# Patient Record
Sex: Male | Born: 1990 | Hispanic: Yes | Marital: Married | Smoking: Never smoker
Health system: Southern US, Community
[De-identification: ages and names within clinical notes are randomized; demographics above are authoritative.]

## PROBLEM LIST (undated history)

## (undated) DIAGNOSIS — J45909 Unspecified asthma, uncomplicated: Secondary | ICD-10-CM

## (undated) DIAGNOSIS — T7840XA Allergy, unspecified, initial encounter: Secondary | ICD-10-CM

## (undated) HISTORY — PX: APPENDECTOMY: SHX54

## (undated) HISTORY — PX: WISDOM TOOTH EXTRACTION: SHX21

## (undated) HISTORY — DX: Allergy, unspecified, initial encounter: T78.40XA

## (undated) HISTORY — DX: Unspecified asthma, uncomplicated: J45.909

---
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
## 2014-02-19 ENCOUNTER — Emergency Department: Payer: Self-pay | Admitting: Emergency Medicine

## 2015-11-27 IMAGING — CR DG LUMBAR SPINE 2-3V
1 series · 3 of 3 positions shown · non-contrast
Comparison: None

CLINICAL DATA: Fell 6 feet from ladder landing on LEFT side, LEFT
hip and low back pain

EXAM:
LUMBAR SPINE - 2-3 VIEW

[Series 1: dxr lumbar spine ap and lateral · 0.14mm/px · 3 of 3 slices shown]
[im 1/3]
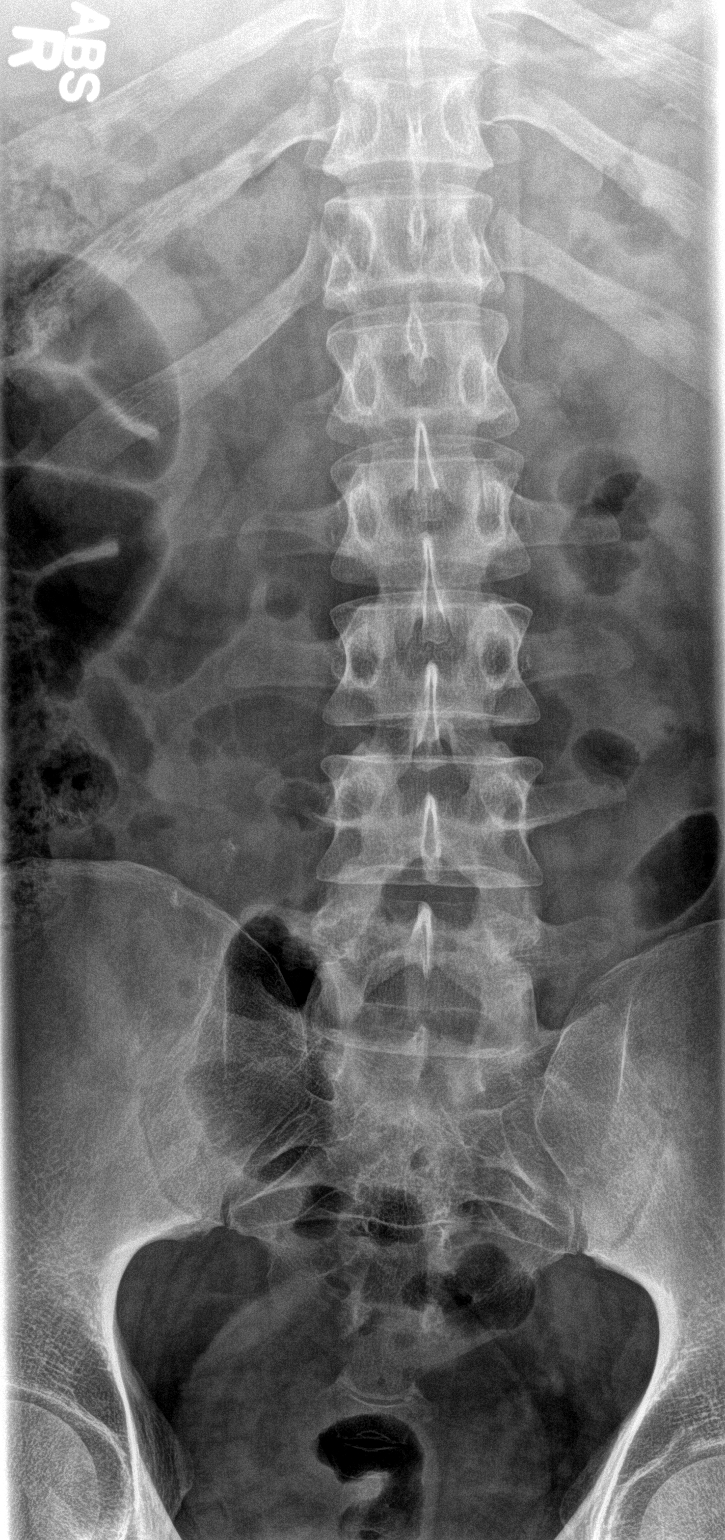
[im 2/3]
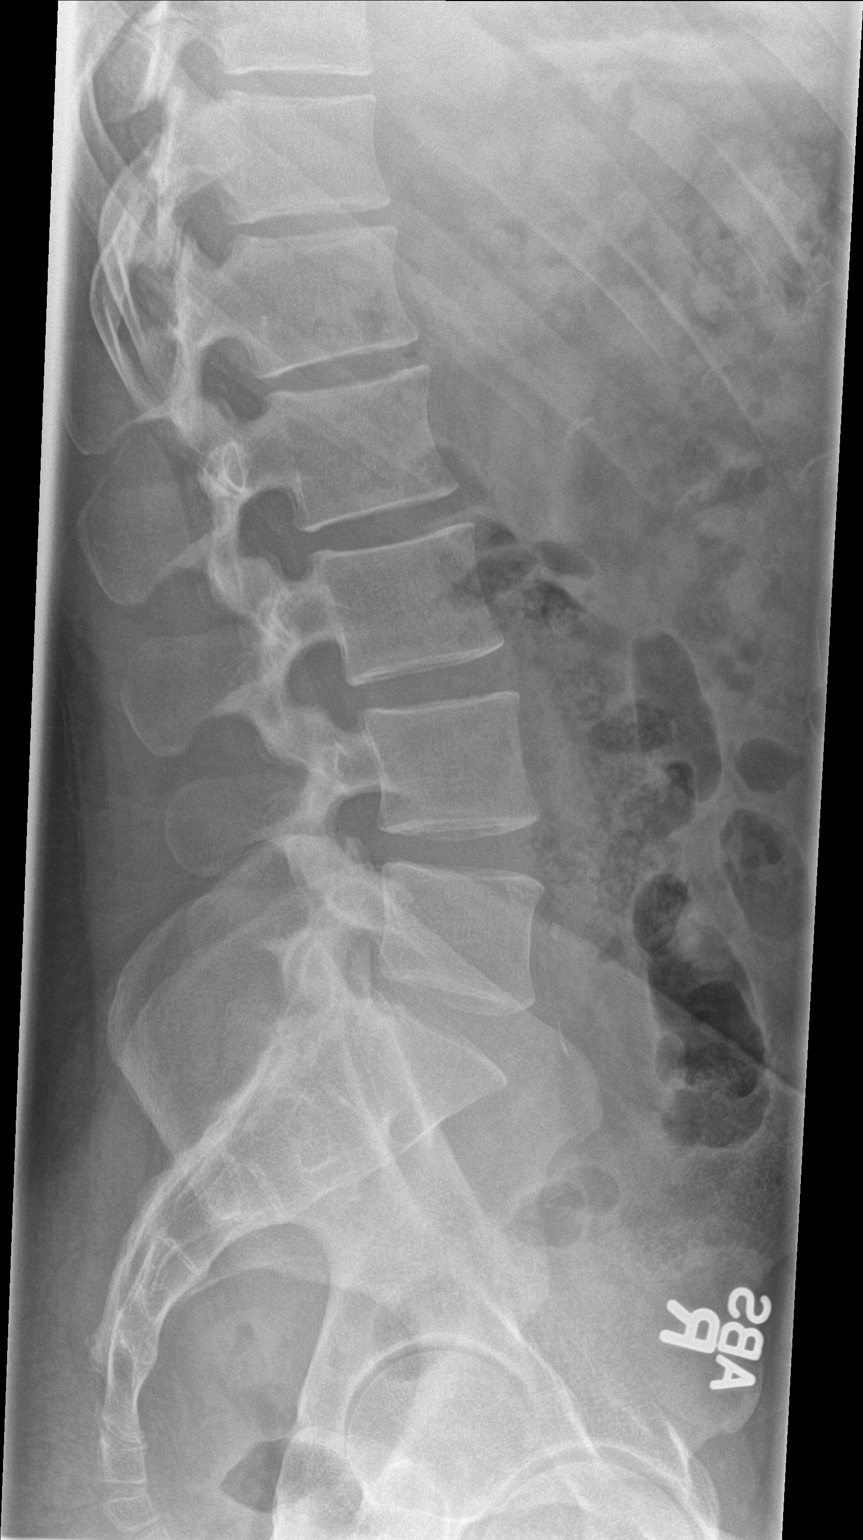
[im 3/3]
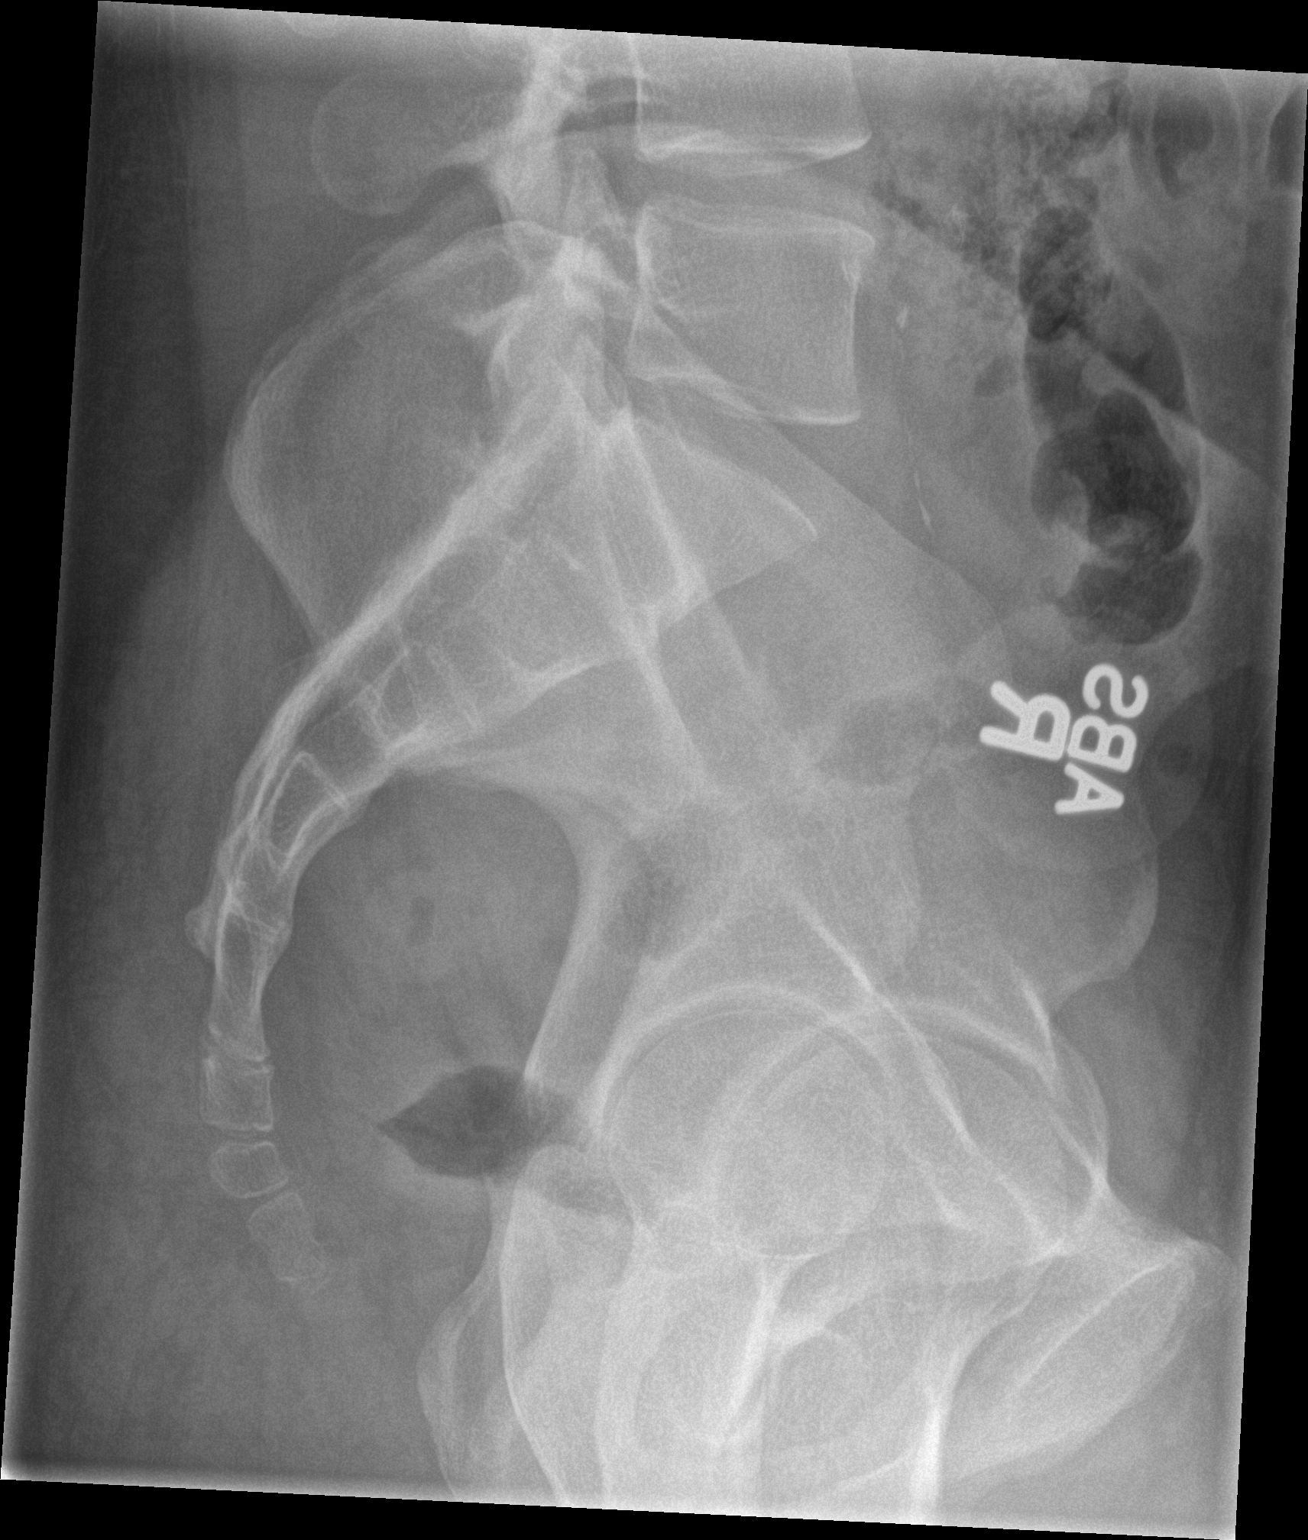

[3 of 3 positions shown; findings below may reference images not displayed]

FINDINGS: Osseous mineralization grossly normal.

5 non-rib-bearing lumbar vertebrae.

Vertebral body and disc space heights maintained.

No acute fracture, subluxation or bone destruction.

No gross evidence of spondylolysis.

SI joints preserved with visualized portion of pelvis intact.

Minimal RIGHT paraspinal density at L5-S1 of uncertain etiology,
question bowel artifact.
IMPRESSION: Normal lumbar spine radiographs.

## 2015-11-27 IMAGING — CR DG FEMUR 2V*L*
1 series · 4 of 4 positions shown · non-contrast
Comparison: None.

CLINICAL DATA: Recent fall of approximately 6 feet with left hip
pain

EXAM:
LEFT FEMUR - 2 VIEW

[Series 1: dxr femur left · 0.14mm/px · 4 of 4 slices shown]
[im 1/4]
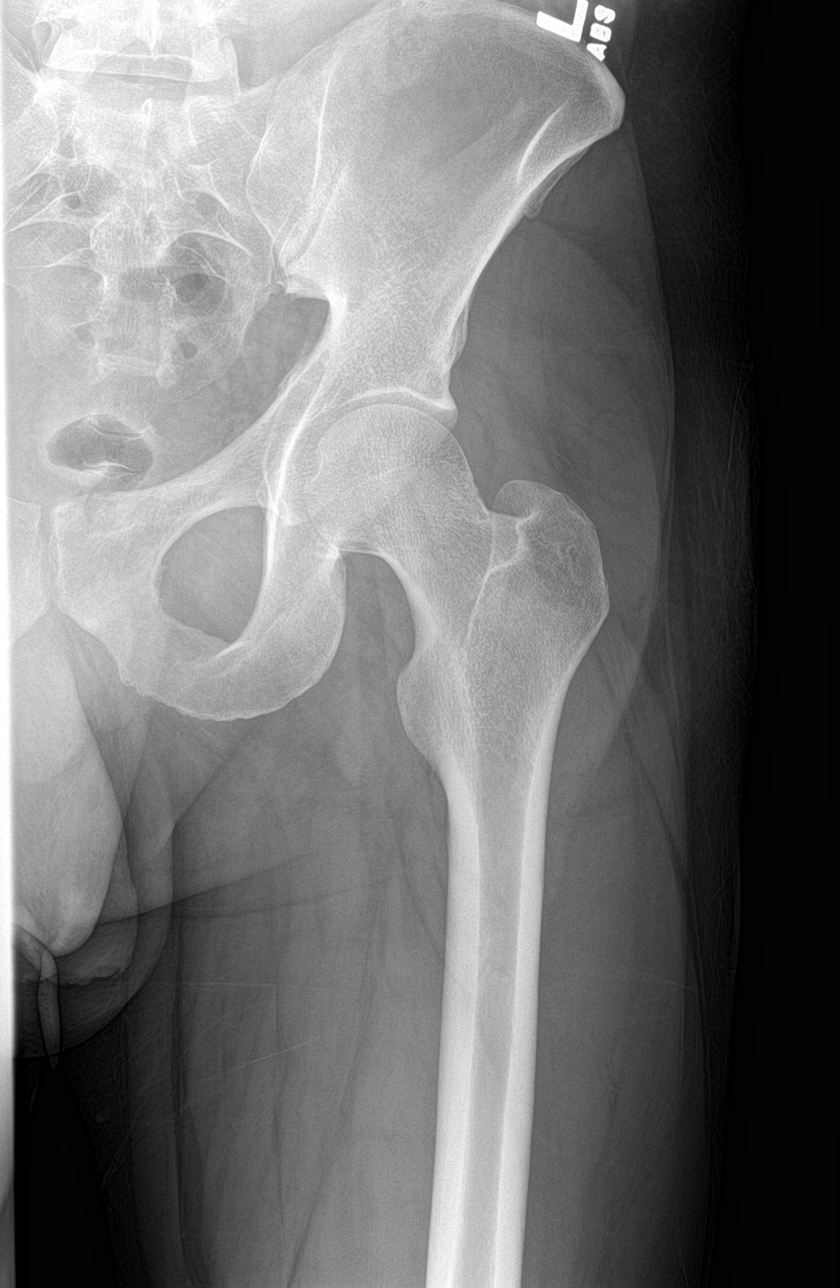
[im 2/4]
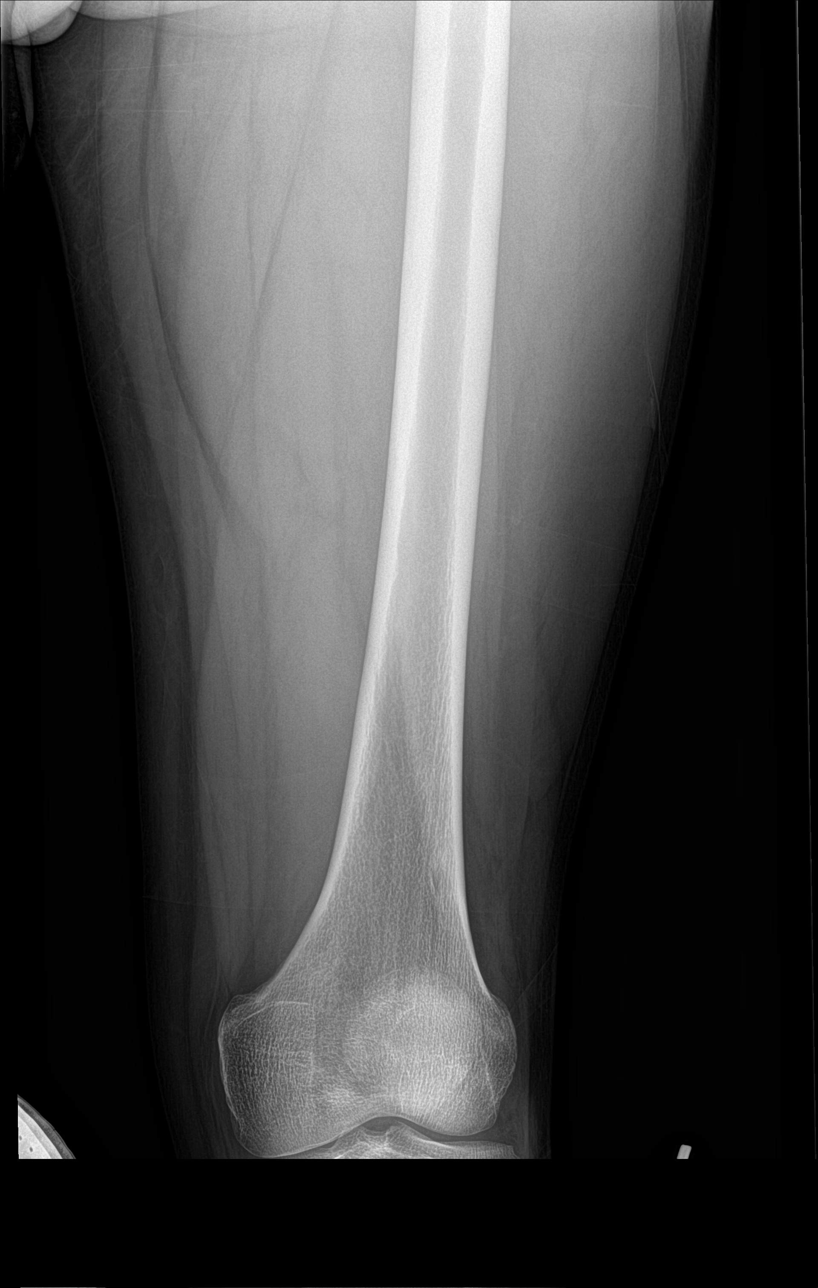
[im 3/4]
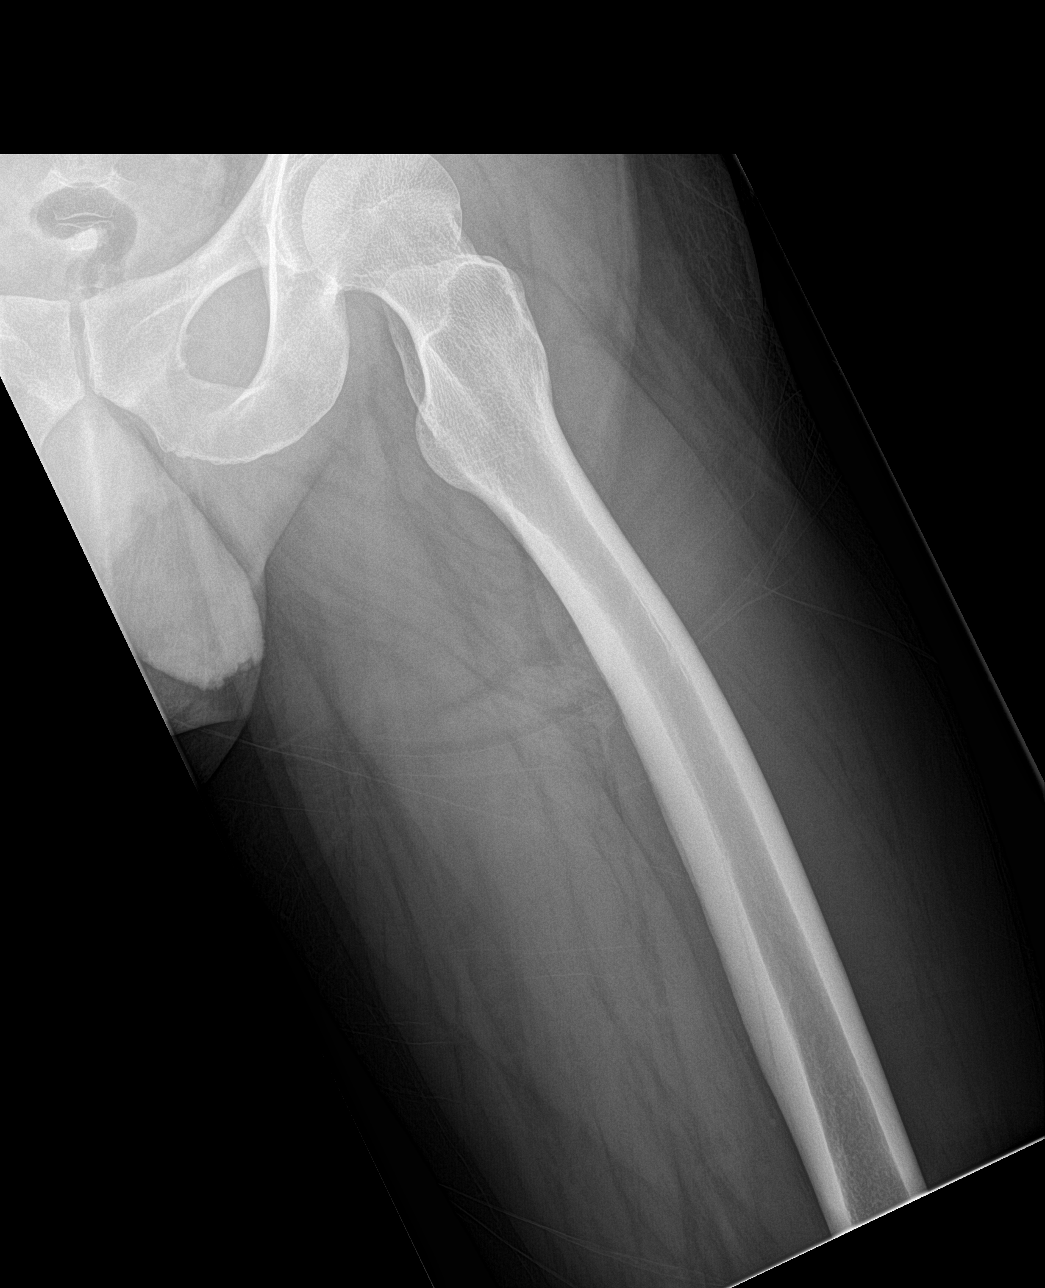
[im 4/4]
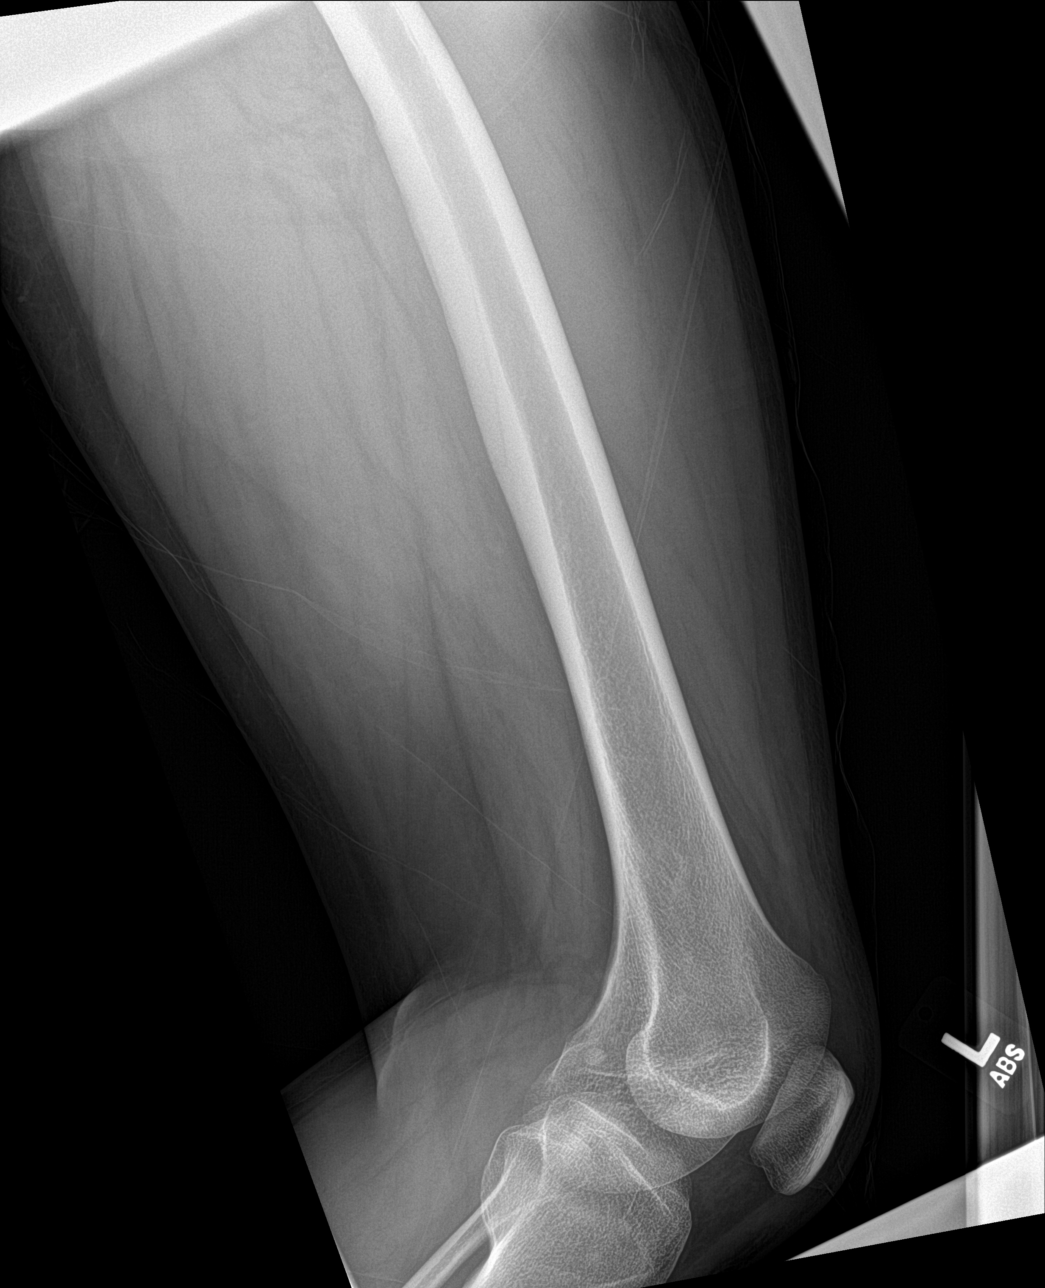

[4 of 4 positions shown; findings below may reference images not displayed]

FINDINGS: The left hip and femur are unremarkable with no acute abnormality.
Alignment is normal.
IMPRESSION: Negative.

## 2018-05-10 ENCOUNTER — Ambulatory Visit: Payer: Self-pay | Admitting: Internal Medicine

## 2018-05-16 ENCOUNTER — Ambulatory Visit: Payer: Self-pay | Admitting: Internal Medicine

## 2018-06-12 ENCOUNTER — Ambulatory Visit (INDEPENDENT_AMBULATORY_CARE_PROVIDER_SITE_OTHER): Payer: 59 | Admitting: Internal Medicine

## 2018-06-12 ENCOUNTER — Encounter: Payer: Self-pay | Admitting: Internal Medicine

## 2018-06-12 ENCOUNTER — Other Ambulatory Visit: Payer: Self-pay

## 2018-06-12 VITALS — BP 140/90 | HR 91 | Temp 98.2°F | Ht 68.0 in | Wt 186.2 lb

## 2018-06-12 DIAGNOSIS — Z1159 Encounter for screening for other viral diseases: Secondary | ICD-10-CM | POA: Diagnosis not present

## 2018-06-12 DIAGNOSIS — Z0184 Encounter for antibody response examination: Secondary | ICD-10-CM | POA: Diagnosis not present

## 2018-06-12 DIAGNOSIS — J309 Allergic rhinitis, unspecified: Secondary | ICD-10-CM

## 2018-06-12 DIAGNOSIS — Z1389 Encounter for screening for other disorder: Secondary | ICD-10-CM | POA: Diagnosis not present

## 2018-06-12 DIAGNOSIS — Z1329 Encounter for screening for other suspected endocrine disorder: Secondary | ICD-10-CM | POA: Diagnosis not present

## 2018-06-12 DIAGNOSIS — E559 Vitamin D deficiency, unspecified: Secondary | ICD-10-CM | POA: Diagnosis not present

## 2018-06-12 DIAGNOSIS — Z0000 Encounter for general adult medical examination without abnormal findings: Secondary | ICD-10-CM

## 2018-06-12 DIAGNOSIS — Z13220 Encounter for screening for lipoid disorders: Secondary | ICD-10-CM | POA: Diagnosis not present

## 2018-06-12 DIAGNOSIS — R030 Elevated blood-pressure reading, without diagnosis of hypertension: Secondary | ICD-10-CM

## 2018-06-12 DIAGNOSIS — Z23 Encounter for immunization: Secondary | ICD-10-CM | POA: Diagnosis not present

## 2018-06-12 DIAGNOSIS — J4520 Mild intermittent asthma, uncomplicated: Secondary | ICD-10-CM

## 2018-06-12 LAB — COMPREHENSIVE METABOLIC PANEL
ALK PHOS: 94 U/L (ref 39–117)
ALT: 39 U/L (ref 0–53)
AST: 30 U/L (ref 0–37)
Albumin: 4.7 g/dL (ref 3.5–5.2)
BILIRUBIN TOTAL: 0.6 mg/dL (ref 0.2–1.2)
BUN: 16 mg/dL (ref 6–23)
CO2: 29 meq/L (ref 19–32)
Calcium: 9.7 mg/dL (ref 8.4–10.5)
Chloride: 102 mEq/L (ref 96–112)
Creatinine, Ser: 0.92 mg/dL (ref 0.40–1.50)
GFR: 97.96 mL/min (ref >60.00)
GLUCOSE: 99 mg/dL (ref 70–99)
Potassium: 4.6 mEq/L (ref 3.5–5.1)
SODIUM: 137 meq/L (ref 135–145)
TOTAL PROTEIN: 7.7 g/dL (ref 6.0–8.3)

## 2018-06-12 LAB — CBC WITH DIFFERENTIAL/PLATELET
BASOS ABS: 0 10*3/uL (ref 0.0–0.1)
Basophils Relative: 0.6 % (ref 0.0–3.0)
Eosinophils Absolute: 0.4 10*3/uL (ref 0.0–0.7)
Eosinophils Relative: 7.4 % — ABNORMAL HIGH (ref 0.0–5.0)
HCT: 46.6 % (ref 39.0–52.0)
Hemoglobin: 16.3 g/dL (ref 13.0–17.0)
LYMPHS ABS: 1.8 10*3/uL (ref 0.7–4.0)
Lymphocytes Relative: 34.3 % (ref 12.0–46.0)
MCHC: 34.9 g/dL (ref 30.0–36.0)
MCV: 89.1 fl (ref 78.0–100.0)
MONO ABS: 0.3 10*3/uL (ref 0.1–1.0)
Monocytes Relative: 5.9 % (ref 3.0–12.0)
NEUTROS ABS: 2.7 10*3/uL (ref 1.4–7.7)
NEUTROS PCT: 51.8 % (ref 43.0–77.0)
PLATELETS: 223 10*3/uL (ref 150.0–400.0)
RBC: 5.23 Mil/uL (ref 4.22–5.81)
RDW: 13.2 % (ref 11.5–15.5)
WBC: 5.2 10*3/uL (ref 4.0–10.5)

## 2018-06-12 LAB — LIPID PANEL
CHOLESTEROL: 205 mg/dL — AB (ref 0–200)
HDL: 43.9 mg/dL (ref >39.00)
LDL Cholesterol: 135 mg/dL — ABNORMAL HIGH (ref 0–99)
NONHDL: 161.4
Total CHOL/HDL Ratio: 5
Triglycerides: 132 mg/dL (ref 0.0–149.0)
VLDL: 26.4 mg/dL (ref 0.0–40.0)

## 2018-06-12 LAB — VITAMIN D 25 HYDROXY (VIT D DEFICIENCY, FRACTURES): VITD: 23.06 ng/mL — AB (ref 30.00–100.00)

## 2018-06-12 LAB — TSH: TSH: 2.1 u[IU]/mL (ref 0.35–4.50)

## 2018-06-12 LAB — T4, FREE: Free T4: 0.88 ng/dL (ref 0.60–1.60)

## 2018-06-12 MED ORDER — MONTELUKAST SODIUM 10 MG PO TABS: 10.0000 mg | ORAL_TABLET | Freq: Every day | ORAL | 3 refills | Status: DC

## 2018-06-12 MED ORDER — ALBUTEROL SULFATE HFA 108 (90 BASE) MCG/ACT IN AERS: 1.0000 | INHALATION_SPRAY | Freq: Four times a day (QID) | RESPIRATORY_TRACT | 12 refills | Status: DC | PRN

## 2018-06-12 NOTE — Progress Notes (Signed)
Chief Complaint  Patient presents with  . Establish Care   New patient  1. C/o allergies and asthma with sob and wheezing worse with pollen never allergy tested tried flonase claritin D, benadryl eyes swollen puffy, runny nose, cough with pollen exposure was on singular in teens and helped   Review of Systems  Constitutional: Negative for weight loss.  HENT: Negative for hearing loss.   Eyes: Negative for blurred vision.  Respiratory: Positive for shortness of breath and wheezing.        Sob wheezing with allergies   Cardiovascular: Negative for chest pain.  Gastrointestinal: Negative for abdominal pain.  Musculoskeletal: Negative for falls.  Skin: Negative for rash.  Neurological: Negative for headaches.  Endo/Heme/Allergies: Positive for environmental allergies.  Psychiatric/Behavioral: Negative for depression.   Past Medical History:  Diagnosis Date  . Allergy   . Asthma    Past Surgical History:  Procedure Laterality Date  . APPENDECTOMY    . WISDOM TOOTH EXTRACTION     Family History  Problem Relation Age of Onset  . Cancer Mother        breast   . Arthritis Mother   . Miscarriages / [place] Mother   . Diabetes Father   . Alcohol abuse Father   . Arthritis Father   . Drug abuse Father   . Hyperlipidemia Father   . Hypertension Father   . Kidney disease Father   . Diabetes Other        mat and pat aunts/uncles with DM   . Hypertension Brother   . Kidney disease Maternal Grandfather   . Early death Paternal Grandmother   . Early death Paternal Grandfather   . Diabetes Sibling   . Drug abuse Sibling   . Hearing loss Sibling   . Miscarriages / Stillbirths Sister   . Alcohol abuse Sister   . Asthma Sister   . Depression Sister   . Alcohol abuse Brother   . Asthma Brother   . Drug abuse Brother   . Early death Brother    Social History   Socioeconomic History  . Marital status: Single    Spouse name: Not on file  . Number of children: Not on file   . Years of education: Not on file  . Highest education level: Not on file  Occupational History  . Not on file  Social Needs  . Financial resource strain: Not on file  . Food insecurity:    Worry: Not on file    Inability: Not on file  . Transportation needs:    Medical: Not on file    Non-medical: Not on file  Tobacco Use  . Smoking status: Never Smoker  . Smokeless tobacco: Never Used  Substance and Sexual Activity  . Alcohol use: Yes  . Drug use: Not Currently  . Sexual activity: Yes  Lifestyle  . Physical activity:    Days per week: Not on file    Minutes per session: Not on file  . Stress: Not on file  Relationships  . Social connections:    Talks on phone: Not on file    Gets together: Not on file    Attends religious service: Not on file    Active member of club or organization: Not on file    Attends meetings of clubs or organizations: Not on file    Relationship status: Not on file  . Intimate partner violence:    Fear of current or ex partner: Not on file  Emotionally abused: Not on file    Physically abused: Not on file    Forced sexual activity: Not on file  Other Topics Concern  . Not on file  Social History Narrative   Married    1 daughter    Works with gutters     No guns, wears seat belt, safe in relationship    HS ed    General labor    Current Meds  Medication Sig  . fexofenadine (ALLEGRA) 180 MG tablet Take 180 mg by mouth daily.   No Known Allergies No results found for this or any previous visit (from the past 2160 hour(s)). Objective  Body mass index is 28.31 kg/m. Wt Readings from Last 3 Encounters:  06/12/18 186 lb 3.2 oz (84.5 kg)   Temp Readings from Last 3 Encounters:  06/12/18 98.2 F (36.8 C) (Oral)   BP Readings from Last 3 Encounters:  06/12/18 140/90   Pulse Readings from Last 3 Encounters:  06/12/18 91    Physical Exam Vitals signs and nursing note reviewed.  Constitutional:      Appearance: Normal  appearance. He is well-developed and well-groomed.  HENT:     Head: Normocephalic and atraumatic.     Nose: Nose normal.     Mouth/Throat:     Mouth: Mucous membranes are moist.     Pharynx: Oropharynx is clear.  Eyes:     Conjunctiva/sclera: Conjunctivae normal.     Pupils: Pupils are equal, round, and reactive to light.  Cardiovascular:     Rate and Rhythm: Normal rate and regular rhythm.     Heart sounds: Normal heart sounds. No murmur.  Pulmonary:     Effort: Pulmonary effort is normal.     Breath sounds: Normal breath sounds.  Skin:    General: Skin is warm and dry.  Neurological:     General: No focal deficit present.     Mental Status: He is alert and oriented to person, place, and time. Mental status is at baseline.     Gait: Gait normal.  Psychiatric:        Attention and Perception: Attention and perception normal.        Mood and Affect: Mood and affect normal.        Speech: Speech normal.        Behavior: Behavior normal. Behavior is cooperative.        Thought Content: Thought content normal.        Cognition and Memory: Cognition and memory normal.        Judgment: Judgment normal.     Assessment   1.asthma and allergies uncontrolled  2. Elevated BP 3. HM Plan   1. Singulair, prn otc allergy med doing benadryl for now or other options xyzal 5 mg qhs prn or claritin, allegra, zyrtec  Prn Albuterol inhaler  Hold referral allergy for now  2. Elevated BP will monitor  rec no decongestants  reecheck at f/u  3.  Never had flu  Tdap given today  Fasting labs today   Provider: Dr. French Ana McLean-Scocuzza-Internal Medicine

## 2018-06-12 NOTE — Patient Instructions (Addendum)
No pseduoephedrine or phenylephrine  Another antihistamine Xyzal    Tdap Tdap Vaccine (Tetanus, Diphtheria and Pertussis): What You Need to Know 1. Why get vaccinated? Tetanus, diphtheria and pertussis are very serious diseases. Tdap vaccine can protect [place] from these diseases. And, Tdap vaccine given to pregnant women can protect newborn babies against pertussis.Marland Kitchen TETANUS (Lockjaw) is rare in the [place] today. It causes painful muscle tightening and stiffness, usually all over the body.  It can lead to tightening of muscles in the head and neck so you can't open your mouth, swallow, or sometimes even breathe. Tetanus kills about 1 out of 10 people who are infected even after receiving the best medical care. DIPHTHERIA is also rare in the [place] today. It can cause a thick coating to form in the back of the throat.  It can lead to breathing problems, heart failure, paralysis, and death. PERTUSSIS (Whooping Cough) causes severe coughing spells, which can cause difficulty breathing, vomiting and disturbed sleep.  It can also lead to weight loss, incontinence, and rib fractures. Up to 2 in 100 adolescents and 5 in 100 adults with pertussis are hospitalized or have complications, which could include pneumonia or death. These diseases are caused by bacteria. Diphtheria and pertussis are spread from person to person through secretions from coughing or sneezing. Tetanus enters the body through cuts, scratches, or wounds. Before vaccines, as many as 200,000 cases of diphtheria, 200,000 cases of pertussis, and hundreds of cases of tetanus, were reported in the [place] each year. Since vaccination began, reports of cases for tetanus and diphtheria have dropped by about 99% and for pertussis by about 80%. 2. Tdap vaccine Tdap vaccine can protect adolescents and adults from tetanus, diphtheria, and pertussis. One dose of Tdap is routinely given at age 54 or 71. People who did not get Tdap  at that age should get it as soon as possible. Tdap is especially important for healthcare professionals and anyone having close contact with a baby younger than 12 months. Pregnant women should get a dose of Tdap during every pregnancy, to protect the newborn from pertussis. Infants are most at risk for severe, life-threatening complications from pertussis. Another vaccine, called Td, protects against tetanus and diphtheria, but not pertussis. A Td booster should be given every 10 years. Tdap may be given as one of these boosters if you have never gotten Tdap before. Tdap may also be given after a severe cut or burn to prevent tetanus infection. Your doctor or the person giving you the vaccine can give you more information. Tdap may safely be given at the same time as other vaccines. 3. Some people should not get this vaccine  A person who has ever had a life-threatening allergic reaction after a previous dose of any diphtheria, tetanus or pertussis containing vaccine, OR has a severe allergy to any part of this vaccine, should not get Tdap vaccine. Tell the person giving the vaccine about any severe allergies.  Anyone who had coma or long repeated seizures within 7 days after a childhood dose of DTP or DTaP, or a previous dose of Tdap, should not get Tdap, unless a cause other than the vaccine was found. They can still get Td.  Talk to your doctor if you: ? have seizures or another nervous system problem, ? had severe pain or swelling after any vaccine containing diphtheria, tetanus or pertussis, ? ever had a condition called Guillain-Barr Syndrome (GBS), ? aren't feeling well on the day the  shot is scheduled. 4. Risks With any medicine, including vaccines, there is a chance of side effects. These are usually mild and go away on their own. Serious reactions are also possible but are rare. Most people who get Tdap vaccine do not have any problems with it. Mild problems following Tdap (Did not  interfere with activities)  Pain where the shot was given (about 3 in 4 adolescents or 2 in 3 adults)  Redness or swelling where the shot was given (about 1 person in 5)  Mild fever of at least 100.8F (up to about 1 in 25 adolescents or 1 in 100 adults)  Headache (about 3 or 4 people in 10)  Tiredness (about 1 person in 3 or 4)  Nausea, vomiting, diarrhea, stomach ache (up to 1 in 4 adolescents or 1 in 10 adults)  Chills, sore joints (about 1 person in 10)  Body aches (about 1 person in 3 or 4)  Rash, swollen glands (uncommon) Moderate problems following Tdap (Interfered with activities, but did not require medical attention)  Pain where the shot was given (up to 1 in 5 or 6)  Redness or swelling where the shot was given (up to about 1 in 16 adolescents or 1 in 12 adults)  Fever over 102F (about 1 in 100 adolescents or 1 in 250 adults)  Headache (about 1 in 7 adolescents or 1 in 10 adults)  Nausea, vomiting, diarrhea, stomach ache (up to 1 or 3 people in 100)  Swelling of the entire arm where the shot was given (up to about 1 in 500). Severe problems following Tdap (Unable to perform usual activities; required medical attention)  Swelling, severe pain, bleeding and redness in the arm where the shot was given (rare). Problems that could happen after any vaccine:  People sometimes faint after a medical procedure, including vaccination. Sitting or lying down for about 15 minutes can help prevent fainting, and injuries caused by a fall. Tell your doctor if you feel dizzy, or have vision changes or ringing in the ears.  Some people get severe pain in the shoulder and have difficulty moving the arm where a shot was given. This happens very rarely.  Any medication can cause a severe allergic reaction. Such reactions from a vaccine are very rare, estimated at fewer than 1 in a million doses, and would happen within a few minutes to a few hours after the vaccination. As with any  medicine, there is a very remote chance of a vaccine causing a serious injury or death. The safety of vaccines is always being monitored. For more information, visit: http://floyd.org/ 5. What if there is a serious problem? What should I look for?  Look for anything that concerns you, such as signs of a severe allergic reaction, very high fever, or unusual behavior. Signs of a severe allergic reaction can include hives, swelling of the face and throat, difficulty breathing, a fast heartbeat, dizziness, and weakness. These would usually start a few minutes to a few hours after the vaccination. What should I do?  If you think it is a severe allergic reaction or other emergency that can't wait, call 9-1-1 or get the person to the nearest hospital. Otherwise, call your doctor.  Afterward, the reaction should be reported to the Vaccine Adverse Event Reporting System (VAERS). Your doctor might file this report, or you can do it yourself through the VAERS web site at www.vaers.LAgents.no, or by calling 1-(408)398-3590. VAERS does not give medical advice. 6. The [employer]  Vaccine Injury Compensation Program The National Vaccine Injury Compensation Program (VICP) is a federal program that was created to compensate people who may have been injured by certain vaccines. Persons who believe they may have been injured by a vaccine can learn about the program and about filing a claim by calling 1-858-620-0498 or visiting the VICP website at SpiritualWord.at. There is a time limit to file a claim for compensation. 7. How can I learn more?  Ask your doctor. He or she can give you the vaccine package insert or suggest other sources of information.  Call your local or state health department.  Contact the Centers for Disease Control and Prevention (CDC): ? Call (575) 577-9000 (1-800-CDC-INFO) or ? Visit CDC's website at PicCapture.uy Vaccine Information Statement Tdap Vaccine  (05/13/2013) This information is not intended to replace advice given to you by your health care provider. Make sure you discuss any questions you have with your health care provider. Document Released: 09/05/2011 Document Revised: 10/22/2017 Document Reviewed: 10/22/2017 Elsevier Interactive Patient Education  2019 Elsevier Inc.    Allergies, Adult An allergy is when your body's defense system (immune system) overreacts to an otherwise harmless substance (allergen) that you breathe in or eat or something that touches your skin. When you come into contact with something that you are allergic to, your immune system produces certain proteins (antibodies). These proteins cause cells to release chemicals (histamines) that trigger the symptoms of an allergic reaction. Allergies often affect the nasal passages (allergic rhinitis), eyes (allergic conjunctivitis), skin (atopic dermatitis), and stomach. Allergies can be mild or severe. Allergies cannot spread from person to person (are not contagious). They can develop at any age and may be outgrown. What increases the risk? You may be at greater risk of allergies if other people in your family have allergies. What are the signs or symptoms? Symptoms depend on what type of allergy you have. They may include:  Runny, stuffy nose.  Sneezing.  Itchy mouth, ears, or throat.  Postnasal drip.  Sore throat.  Itchy, red, watery, or puffy eyes.  Skin rash or hives.  Stomach pain.  Vomiting.  Diarrhea.  Bloating.  Wheezing or coughing. People with a severe allergy to food, medicine, or an insect bite may have a life-threatening allergic reaction (anaphylaxis). Symptoms of anaphylaxis include:  Hives.  Itching.  Flushed face.  Swollen lips, tongue, or mouth.  Tight or swollen throat.  Chest pain or tightness in the chest.  Trouble breathing or shortness of breath.  Rapid heartbeat.  Dizziness or  fainting.  Vomiting.  Diarrhea.  Pain in the abdomen. How is this diagnosed? This condition is diagnosed based on:  Your symptoms.  Your family and medical history.  A physical exam. You may need to see a health care provider who specializes in treating allergies (allergist). You may also have tests, including:  Skin tests to see which allergens are causing your symptoms, such as: ? Skin prick test. In this test, your skin is pricked with a tiny needle and exposed to small amounts of possible allergens to see if your skin reacts. ? Intradermal skin test. In this test, a small amount of allergen is injected under your skin to see if your skin reacts. ? Patch test. In this test, a small amount of allergen is placed on your skin and then your skin is covered with a bandage. Your health care provider will check your skin after a couple of days to see if a rash has developed.  Blood  tests.  Challenges tests. In this test, you inhale a small amount of allergen by mouth to see if you have an allergic reaction. You may also be asked to:  Keep a food diary. A food diary is a record of all the foods and drinks you have in a day and any symptoms you experience.  Practice an elimination diet. An elimination diet involves eliminating specific foods from your diet and then adding them back in one by one to find out if a certain food causes an allergic reaction. How is this treated? Treatment for allergies depends on your symptoms. Treatment may include:  Cold compresses to soothe itching and swelling.  Eye drops.  Nasal sprays.  Using a saline spray or container (neti pot) to flush out the nose (nasal irrigation). These methods can help clear away mucus and keep the nasal passages moist.  Using a humidifier.  Oral antihistamines or other medicines to block allergic reaction and inflammation.  Skin creams to treat rashes or itching.  Diet changes to eliminate food allergy  triggers.  Repeated exposure to tiny amounts of allergens to build up a tolerance and prevent future allergic reactions (immunotherapy). These include: ? Allergy shots. ? Oral treatment. This involves taking small doses of an allergen under the tongue (sublingual immunotherapy).  Emergency epinephrine injection (auto-injector) in case of an allergic emergency. This is a self-injectable, pre-measured medicine that must be given within the first few minutes of a serious allergic reaction. Follow these instructions at home:         Avoid known allergens whenever possible.  If you suffer from airborne allergens, wash out your nose daily. You can do this with a saline spray or a neti pot to flush out your nose (nasal irrigation).  Take over-the-counter and prescription medicines only as told by your health care provider.  Keep all follow-up visits as told by your health care provider. This is important.  If you are at risk of a severe allergic reaction (anaphylaxis), keep your auto-injector with you at all times.  If you have ever had anaphylaxis, wear a medical alert bracelet or necklace that states you have a severe allergy. Contact a health care provider if:  Your symptoms do not improve with treatment. Get help right away if:  You have symptoms of anaphylaxis, such as: ? Swollen mouth, tongue, or throat. ? Pain or tightness in your chest. ? Trouble breathing or shortness of breath. ? Dizziness or fainting. ? Severe abdominal pain, vomiting, or diarrhea. This information is not intended to replace advice given to you by your health care provider. Make sure you discuss any questions you have with your health care provider. Document Released: 05/30/2002 Document Revised: 07/05/2016 Document Reviewed: 09/22/2015 Elsevier Interactive Patient Education  2019 [employer].

## 2018-06-13 LAB — URINALYSIS, ROUTINE W REFLEX MICROSCOPIC
BACTERIA UA: NONE SEEN /HPF
Bilirubin Urine: NEGATIVE
Glucose, UA: NEGATIVE
HYALINE CAST: NONE SEEN /LPF
Hgb urine dipstick: NEGATIVE
Ketones, ur: NEGATIVE
Nitrite: NEGATIVE
Protein, ur: NEGATIVE
SPECIFIC GRAVITY, URINE: 1.02 (ref 1.001–1.03)
SQUAMOUS EPITHELIAL / LPF: NONE SEEN /HPF (ref <=5)
WBC, UA: NONE SEEN /HPF (ref 0–5)
pH: 5.5 (ref 5.0–8.0)

## 2018-06-13 LAB — MEASLES/MUMPS/RUBELLA IMMUNITY
Mumps IgG: <9 AU/mL — ABNORMAL LOW
Rubella: <0.9 index — ABNORMAL LOW
Rubeola IgG: 16.6 AU/mL

## 2018-06-13 LAB — HEPATITIS B SURFACE ANTIBODY, QUANTITATIVE

## 2018-09-23 ENCOUNTER — Telehealth: Payer: Self-pay

## 2018-09-23 NOTE — Telephone Encounter (Signed)
Called back and spoke to patient's wife.  Patient's wife said that patient's co-worker's COVID test came back positive today and patient's employer recommended that patient be tested also for COVID.  Patient's wife said that patient will go to the [facility] to be tested.  Patient's wife said that patient currently does not have any COVID symptoms.

## 2018-09-23 NOTE — Telephone Encounter (Signed)
Copied from [place] [id number]. Topic: General - Other >> Sep 23, 2018  2:00 PM Mcneil, Ja-Kwan wrote: Reason for CRM: Pt wife Oris Drone stated pt has a co-worker that tested positive for Covid-19 and she would like him to be tested. Bernice requests a call back. Cb# [phone]

## 2018-12-03 ENCOUNTER — Emergency Department
Admission: EM | Admit: 2018-12-03 | Discharge: 2018-12-03 | Disposition: A | Discharge status: Home / Self Care | Payer: 59

## 2018-12-03 DIAGNOSIS — R064 Hyperventilation: Secondary | ICD-10-CM | POA: Diagnosis not present

## 2018-12-03 DIAGNOSIS — U071 COVID-19: Secondary | ICD-10-CM | POA: Diagnosis not present

## 2018-12-03 DIAGNOSIS — R000 Tachycardia, unspecified: Secondary | ICD-10-CM | POA: Diagnosis not present

## 2018-12-03 DIAGNOSIS — R202 Paresthesia of skin: Secondary | ICD-10-CM | POA: Diagnosis not present

## 2018-12-03 DIAGNOSIS — R0689 Other abnormalities of breathing: Secondary | ICD-10-CM | POA: Diagnosis not present

## 2018-12-03 NOTE — ED Triage Notes (Signed)
Patient to ER from home via [facility] for c/o tachypnea. Patient called out to EMS for shortness of breath. Upon arrival of EMS to home, patient was breathing approx 50 times per minute and c/o not being able to feel his hands and feet. Patient had just learned his nephew tested postive for Covid. Patient denies any cough or fever. HR was 120 (sinus tach) for EMS with 136 CBG. Patient ambulatory in lobby in no acute distress.

## 2018-12-06 ENCOUNTER — Telehealth: Payer: Self-pay

## 2018-12-06 ENCOUNTER — Ambulatory Visit: Payer: 59 | Admitting: Family Medicine

## 2018-12-06 NOTE — Telephone Encounter (Signed)
Tried to call wife no answer my recommendation would be to go to ED   [place]

## 2018-12-06 NOTE — Telephone Encounter (Signed)
Patient's wife called to schedule patient an appointment.  Call was transferred to me to triage.  Patient's wife said that patient was exposed Monday to a family member who was positive for COVID.  Pt's wife said that patient started developing symptoms on Tuesday.  Patient's wife said that patient has fever of 103 and 102, chills, body aches, vomiting, diarrhea, fatigue, shortness of breath (worse at night).  Patient's wife said that she has not been staying in the home with patient since Monday and she has been checking in on patient by phone.  Patient's wife said that patient is still having the same symptoms but she is unsure how high patient's temperature is today since she has been calling patient and he is not answering his phone.    Recommended that patient be evaluated in person at ED given ongoing symptoms.

## 2018-12-06 NOTE — Telephone Encounter (Signed)
Called patient back and spoke to patient.  Consulted with Philis Nettle, NP.   Offered patient a virtual visit w/ Lauren Guse today at 4:00 pm and recommended patient to go by [facility] for drive-thru COVID testing before virtual appt.  Patient declined stating that he didn't know if he would be awake during this time.  Patient said that he didn't want to talk anymore and hung up the phone.

## 2018-12-06 NOTE — Telephone Encounter (Signed)
Spoke with pt feeling better temp 101, sob improved, cough improved still with chills body aches diarrhea/vomitting  He had gatorade water pedialyte  If worse rec go to ED   [place]

## 2018-12-09 ENCOUNTER — Other Ambulatory Visit: Payer: Self-pay

## 2018-12-09 DIAGNOSIS — R6889 Other general symptoms and signs: Secondary | ICD-10-CM | POA: Diagnosis not present

## 2018-12-09 DIAGNOSIS — Z20822 Contact with and (suspected) exposure to covid-19: Secondary | ICD-10-CM

## 2018-12-11 LAB — NOVEL CORONAVIRUS, NAA: SARS-CoV-2, NAA: NOT DETECTED

## 2018-12-12 ENCOUNTER — Telehealth: Payer: Self-pay

## 2018-12-12 NOTE — Telephone Encounter (Signed)
Answered patients wife's questions. Gave her the guide lines for quarantining.

## 2018-12-12 NOTE — Telephone Encounter (Signed)
Copied from [place] [id number]. Topic: General - Other >> Dec 12, 2018 12:28 PM Keene Breath wrote: Reason for CRM: Patient's wife called to ask the nurse to call her regarding COVID results for her husband.  She has a few questions.  CB# [phone]

## 2018-12-13 ENCOUNTER — Ambulatory Visit: Payer: Self-pay | Admitting: Internal Medicine

## 2019-01-01 ENCOUNTER — Ambulatory Visit: Payer: Self-pay | Admitting: Internal Medicine

## 2019-01-10 ENCOUNTER — Ambulatory Visit: Payer: Self-pay

## 2019-06-13 ENCOUNTER — Other Ambulatory Visit: Payer: Self-pay

## 2019-06-13 ENCOUNTER — Encounter: Payer: Self-pay | Admitting: Internal Medicine

## 2019-06-13 ENCOUNTER — Ambulatory Visit (INDEPENDENT_AMBULATORY_CARE_PROVIDER_SITE_OTHER): Payer: 59 | Admitting: Internal Medicine

## 2019-06-13 VITALS — BP 122/84 | HR 88 | Temp 96.2°F | Ht 68.0 in | Wt 191.2 lb

## 2019-06-13 DIAGNOSIS — E785 Hyperlipidemia, unspecified: Secondary | ICD-10-CM

## 2019-06-13 DIAGNOSIS — Z1329 Encounter for screening for other suspected endocrine disorder: Secondary | ICD-10-CM | POA: Diagnosis not present

## 2019-06-13 DIAGNOSIS — Z0184 Encounter for antibody response examination: Secondary | ICD-10-CM | POA: Diagnosis not present

## 2019-06-13 DIAGNOSIS — E559 Vitamin D deficiency, unspecified: Secondary | ICD-10-CM | POA: Diagnosis not present

## 2019-06-13 DIAGNOSIS — Z1159 Encounter for screening for other viral diseases: Secondary | ICD-10-CM | POA: Diagnosis not present

## 2019-06-13 DIAGNOSIS — J4520 Mild intermittent asthma, uncomplicated: Secondary | ICD-10-CM

## 2019-06-13 DIAGNOSIS — K4090 Unilateral inguinal hernia, without obstruction or gangrene, not specified as recurrent: Secondary | ICD-10-CM | POA: Insufficient documentation

## 2019-06-13 DIAGNOSIS — Z0000 Encounter for general adult medical examination without abnormal findings: Secondary | ICD-10-CM

## 2019-06-13 LAB — CBC WITH DIFFERENTIAL/PLATELET
Basophils Absolute: 0 10*3/uL (ref 0.0–0.1)
Basophils Relative: 0.4 % (ref 0.0–3.0)
Eosinophils Absolute: 0.3 10*3/uL (ref 0.0–0.7)
Eosinophils Relative: 5.5 % — ABNORMAL HIGH (ref 0.0–5.0)
HCT: 43.9 % (ref 39.0–52.0)
Hemoglobin: 15.3 g/dL (ref 13.0–17.0)
Lymphocytes Relative: 36.5 % (ref 12.0–46.0)
Lymphs Abs: 1.8 10*3/uL (ref 0.7–4.0)
MCHC: 34.8 g/dL (ref 30.0–36.0)
MCV: 89 fl (ref 78.0–100.0)
Monocytes Absolute: 0.3 10*3/uL (ref 0.1–1.0)
Monocytes Relative: 6.2 % (ref 3.0–12.0)
Neutro Abs: 2.5 10*3/uL (ref 1.4–7.7)
Neutrophils Relative %: 51.4 % (ref 43.0–77.0)
Platelets: 169 10*3/uL (ref 150.0–400.0)
RBC: 4.94 Mil/uL (ref 4.22–5.81)
RDW: 13.7 % (ref 11.5–15.5)
WBC: 4.9 10*3/uL (ref 4.0–10.5)

## 2019-06-13 LAB — COMPREHENSIVE METABOLIC PANEL
ALT: 35 U/L (ref 0–53)
AST: 23 U/L (ref 0–37)
Albumin: 4.4 g/dL (ref 3.5–5.2)
Alkaline Phosphatase: 78 U/L (ref 39–117)
BUN: 13 mg/dL (ref 6–23)
CO2: 29 mEq/L (ref 19–32)
Calcium: 9.3 mg/dL (ref 8.4–10.5)
Chloride: 103 mEq/L (ref 96–112)
Creatinine, Ser: 0.89 mg/dL (ref 0.40–1.50)
GFR: 101.06 mL/min (ref >60.00)
Glucose, Bld: 104 mg/dL — ABNORMAL HIGH (ref 70–99)
Potassium: 3.8 mEq/L (ref 3.5–5.1)
Sodium: 136 mEq/L (ref 135–145)
Total Bilirubin: 0.6 mg/dL (ref 0.2–1.2)
Total Protein: 7.4 g/dL (ref 6.0–8.3)

## 2019-06-13 LAB — LIPID PANEL
Cholesterol: 186 mg/dL (ref 0–200)
HDL: 36.8 mg/dL — ABNORMAL LOW (ref >39.00)
LDL Cholesterol: 119 mg/dL — ABNORMAL HIGH (ref 0–99)
NonHDL: 149.24
Total CHOL/HDL Ratio: 5
Triglycerides: 149 mg/dL (ref 0.0–149.0)
VLDL: 29.8 mg/dL (ref 0.0–40.0)

## 2019-06-13 LAB — VITAMIN D 25 HYDROXY (VIT D DEFICIENCY, FRACTURES): VITD: 35.93 ng/mL (ref 30.00–100.00)

## 2019-06-13 LAB — TSH: TSH: 2.32 u[IU]/mL (ref 0.35–4.50)

## 2019-06-13 MED ORDER — FEXOFENADINE HCL 180 MG PO TABS: 180.0000 mg | ORAL_TABLET | Freq: Every day | ORAL | 3 refills | Status: DC

## 2019-06-13 MED ORDER — MONTELUKAST SODIUM 10 MG PO TABS: 10.0000 mg | ORAL_TABLET | Freq: Every day | ORAL | 3 refills | Status: DC

## 2019-06-13 MED ORDER — ALBUTEROL SULFATE HFA 108 (90 BASE) MCG/ACT IN AERS: 1.0000 | INHALATION_SPRAY | Freq: Four times a day (QID) | RESPIRATORY_TRACT | 11 refills | Status: DC | PRN

## 2019-06-13 NOTE — Progress Notes (Signed)
Patient calling back in. He states that the Singulair only needed to be sent to [facility] pharmacy. All other meds can stay at [facility] in [place]. Medication sent in to preferred pharmacy per protocol.

## 2019-06-13 NOTE — Addendum Note (Signed)
Addended by: Tilford Pillar on: 06/13/2019 09:20 AM   Modules accepted: Orders

## 2019-06-13 NOTE — Progress Notes (Signed)
Chief Complaint  Patient presents with  . Annual Exam   Annual doing well 1. Acne to back worse with sweating nothing tried  2. Right inguinal hernia x 10 years currently not painful but has been in the past lifting 500 lbs at gym  3. Allergies needs refills of meds c/w throat itching at times taking meds  4. Wt weight 180-184 outside of clinic today 191.2 and nothing changed disc could be variance of scales  Review of Systems  Constitutional: Negative for weight loss.  HENT: Negative for hearing loss.   Eyes: Negative for blurred vision.  Respiratory: Negative for shortness of breath.   Cardiovascular: Negative for chest pain.  Gastrointestinal: Negative for abdominal pain and blood in stool.  Musculoskeletal: Negative for falls.  Skin: Negative for rash.  Psychiatric/Behavioral: Negative for depression.   Past Medical History:  Diagnosis Date  . Allergy   . Asthma    Past Surgical History:  Procedure Laterality Date  . APPENDECTOMY    . WISDOM TOOTH EXTRACTION     Family History  Problem Relation Age of Onset  . Cancer Mother        breast   . Arthritis Mother   . Miscarriages / [place] Mother   . Diabetes Father   . Alcohol abuse Father   . Arthritis Father   . Drug abuse Father   . Hyperlipidemia Father   . Hypertension Father   . Kidney disease Father        died 38  . Diabetes Other        mat and pat aunts/uncles with DM   . Hypertension Brother   . Kidney disease Maternal Grandfather   . Early death Paternal Grandmother   . Early death Paternal Grandfather   . Diabetes Sibling   . Drug abuse Sibling   . Hearing loss Sibling   . Miscarriages / Stillbirths Sister   . Alcohol abuse Sister   . Asthma Sister   . Depression Sister   . Alcohol abuse Brother   . Asthma Brother   . Drug abuse Brother   . Early death Brother        68 y.o hit and run [place] 21   Social History   Socioeconomic History  . Marital status: Single    Spouse name:  Not on file  . Number of children: Not on file  . Years of education: Not on file  . Highest education level: Not on file  Occupational History  . Not on file  Tobacco Use  . Smoking status: Never Smoker  . Smokeless tobacco: Never Used  Substance and Sexual Activity  . Alcohol use: Yes  . Drug use: Not Currently  . Sexual activity: Yes  Other Topics Concern  . Not on file  Social History Narrative   Married    1 daughter    Works with gutters     No guns, wears seat belt, safe in relationship    HS ed    General labor    Social Determinants of Health   Financial Resource Strain:   . Difficulty of Paying Living Expenses:   Food Insecurity:   . Worried About Programme researcher, broadcasting/film/video in the Last Year:   . Barista in the Last Year:   Transportation Needs:   . Freight forwarder (Medical):   Marland Kitchen Lack of Transportation (Non-Medical):   Physical Activity:   . Days of Exercise per Week:   .  Minutes of Exercise per Session:   Stress:   . Feeling of Stress :   Social Connections:   . Frequency of Communication with Friends and Family:   . Frequency of Social Gatherings with Friends and Family:   . Attends Religious Services:   . Active Member of Clubs or Organizations:   . Attends Banker Meetings:   Marland Kitchen Marital Status:   Intimate Partner Violence:   . Fear of Current or Ex-Partner:   . Emotionally Abused:   Marland Kitchen Physically Abused:   . Sexually Abused:    Current Meds  Medication Sig  . albuterol (VENTOLIN HFA) 108 (90 Base) MCG/ACT inhaler Inhale 1-2 puffs into the lungs every 6 (six) hours as needed for wheezing or shortness of breath.  . fexofenadine (ALLEGRA) 180 MG tablet Take 1 tablet (180 mg total) by mouth daily.  . montelukast (SINGULAIR) 10 MG tablet Take 1 tablet (10 mg total) by mouth at bedtime.  Marland Kitchen VITAMIN D PO Take by mouth.  . [DISCONTINUED] albuterol (PROVENTIL HFA;VENTOLIN HFA) 108 (90 Base) MCG/ACT inhaler Inhale 1-2 puffs into the lungs  every 6 (six) hours as needed for wheezing or shortness of breath.  . [DISCONTINUED] fexofenadine (ALLEGRA) 180 MG tablet Take 180 mg by mouth daily.  . [DISCONTINUED] montelukast (SINGULAIR) 10 MG tablet Take 1 tablet (10 mg total) by mouth at bedtime.   No Known Allergies No results found for this or any previous visit (from the past 2160 hour(s)). Objective  Body mass index is 29.07 kg/m. Wt Readings from Last 3 Encounters:  06/13/19 191 lb 3.2 oz (86.7 kg)  06/12/18 186 lb 3.2 oz (84.5 kg)   Temp Readings from Last 3 Encounters:  06/13/19 (!) 96.2 F (35.7 C) (Temporal)  06/12/18 98.2 F (36.8 C) (Oral)   BP Readings from Last 3 Encounters:  06/13/19 122/84  06/12/18 140/90   Pulse Readings from Last 3 Encounters:  06/13/19 88  06/12/18 91    Physical Exam Vitals and nursing note reviewed.  Constitutional:      Appearance: Normal appearance. He is well-developed and well-groomed.  HENT:     Head: Normocephalic and atraumatic.  Eyes:     Conjunctiva/sclera: Conjunctivae normal.     Pupils: Pupils are equal, round, and reactive to light.  Cardiovascular:     Rate and Rhythm: Normal rate and regular rhythm.     Heart sounds: Normal heart sounds. No murmur.  Pulmonary:     Effort: Pulmonary effort is normal.     Breath sounds: Normal breath sounds.  Abdominal:     General: Abdomen is flat. Bowel sounds are normal.     Tenderness: There is no abdominal tenderness.  Skin:    General: Skin is warm and dry.  Neurological:     General: No focal deficit present.     Mental Status: He is alert and oriented to person, place, and time. Mental status is at baseline.     Gait: Gait normal.  Psychiatric:        Attention and Perception: Attention and perception normal.        Mood and Affect: Mood and affect normal.        Speech: Speech normal.        Behavior: Behavior normal. Behavior is cooperative.        Thought Content: Thought content normal.        Cognition and  Memory: Cognition and memory normal.        Judgment:  Judgment normal.     Assessment  Plan  Well adult exam -  Fasting labs today  Never had flu  Tdap utd covid vaccine consider rec healthy diet and exercise  Continue with therapy dealing with death of 10 friends, dad and 1 brother   Hyperlipidemia, unspecified hyperlipidemia type - Plan: Lipid panel  Right inguinal hernia W/o sx's consider referral surgery if pain returns  Mild intermittent asthma without complication - Plan: montelukast (SINGULAIR) 10 MG tablet, fexofenadine (ALLEGRA) 180 MG tablet, albuterol (VENTOLIN HFA) 108 (90 Base) MCG/ACT inhaler   Provider: Dr. Olivia Mackie McLean-Scocuzza-[facility]

## 2019-06-13 NOTE — Patient Instructions (Addendum)
Consider MMR vaccine  COVID-19 Vaccine Information can be found at: [email or web address].[email or web address] For questions related to vaccine distribution or appointments, please email [email or web address] or call [phone].   panoxyl wash 4-8% for acne to back over the counter  Debrox ear wax drops to ears leave ear up 5-10 minutes x 4-7 days 1x/month to keep ears clean   Inguinal Hernia, Adult An inguinal hernia is when fat or your intestines push through a weak spot in a muscle where your leg meets your lower belly (groin). This causes a rounded lump (bulge). This kind of hernia could also be:  In your scrotum, if you are male.  In folds of skin around your vagina, if you are male. There are three types of inguinal hernias. These include:  Hernias that can be pushed back into the belly (are reducible). This type rarely causes pain.  Hernias that cannot be pushed back into the belly (are incarcerated).  Hernias that cannot be pushed back into the belly and lose their blood supply (are strangulated). This type needs emergency surgery. If you do not have symptoms, you may not need treatment. If you have symptoms or a large hernia, you may need surgery. Follow these instructions at home: Lifestyle  Do these things if told by your doctor so you do not have trouble pooping (constipation): ? Drink enough fluid to keep your pee (urine) pale yellow. ? Eat foods that have a lot of fiber. These include fresh fruits and vegetables, whole grains, and beans. ? Limit foods that are high in fat and processed sugars. These include foods that are fried or sweet. ? Take medicine for trouble pooping.  Avoid lifting heavy objects.  Avoid standing for long amounts of time.  Do not use any products that contain nicotine or tobacco. These include cigarettes and e-cigarettes. If you need help quitting, ask your doctor.  Stay at a healthy weight. General  instructions  You may try to push your hernia in by very gently pressing on it when you are lying down. Do not try to force the bulge back in if it will not push in easily.  Watch your hernia for any changes in shape, size, or color. Tell your doctor if you see any changes.  Take over-the-counter and prescription medicines only as told by your doctor.  Keep all follow-up visits as told by your doctor. This is important. Contact a doctor if:  You have a fever.  You have new symptoms.  Your symptoms get worse. Get help right away if:  The area where your leg meets your lower belly has: ? Pain that gets worse suddenly. ? A bulge that gets bigger suddenly, and it does not get smaller after that. ? A bulge that turns red or purple. ? A bulge that is painful when you touch it.  You are a man, and your scrotum: ? Suddenly feels painful. ? Suddenly changes in size.  You cannot push the hernia in by very gently pressing on it when you are lying down. Do not try to force the bulge back in if it will not push in easily.  You feel sick to your stomach (nauseous), and that feeling does not go away.  You throw up (vomit), and that keeps happening.  You have a fast heartbeat.  You cannot poop (have a bowel movement) or pass gas. These symptoms may be an emergency. Do not wait to see if the symptoms will go away. Get medical help right away. Call your local  emergency services (911 in the [place] Summary  An inguinal hernia is when fat or your intestines push through a weak spot in a muscle where your leg meets your lower belly (groin). This causes a rounded lump (bulge).  If you do not have symptoms, you may not need treatment. If you have symptoms or a large hernia, you may need surgery.  Avoid lifting heavy objects. Also avoid standing for long amounts of time.  Do not try to force the bulge back in if it will not push in easily. This information is not intended to replace advice  given to you by your health care provider. Make sure you discuss any questions you have with your health care provider. Document Revised: 04/07/2017 Document Reviewed: 12/06/2016 Elsevier Patient Education  [place] Vaccines, MMR injection What is this medicine? MEASLES VIRUS; MUMPS VIRUS; RUBELLA VIRUS VACCINE LIVE (MEE zuhlz VAHY ruhs; muhmps VAHY ruhs; roo bel uh VAHY ruhs vak SEEN [place] ) is used to prevent an infection with measles (rubeola), mumps, and rubella ([place] measles) viruses. It is used to prevent infection in children over 66 months old, adults that have not been vaccinated and are not pregnant, and anyone traveling to countries where there are high rates of measles, mumps, or rubella. This medicine may be used for other purposes; ask your health care provider or pharmacist if you have questions. COMMON BRAND NAME(S): M-M-R II What should I tell my health care provider before I take this medicine? They need to know if you have any of these conditions:  bleeding disorder  cancer including leukemia or lymphoma  immune system problems  infection with fever  low levels of platelets in the blood  recent blood transfusion or immune globulin infusion  seizure disorder  taking medicines for immunosuppression  an unusual or allergic reaction to vaccines, eggs, neomycin, gelatin, other medicines, foods, dyes, or preservatives  pregnant or trying to get pregnant  breast-feeding How should I use this medicine? This vaccine is for injection under the skin. It is given by a health care professional. A copy of Vaccine Information Statements will be given before each vaccination. Read this sheet carefully each time. The sheet may change frequently. Talk to your pediatrician regarding the use of this medicine in children. While this drug may be prescribed for children as young as 64 months of age for selected conditions, precautions do  apply. Overdosage: If you think you have taken too much of this medicine contact a poison control center or emergency room at once. NOTE: This medicine is only for you. Do not share this medicine with others. What if I miss a dose? Keep appointments for follow-up (booster) doses as directed. It is important not to miss your dose. Call your doctor or health care professional if you are unable to keep an appointment. What may interact with this medicine? Do not take this medicine with any of the following medications:  adalimumab  anakinra  etanercept  infliximab  medicines that suppress your immune system  medicines to treat cancer This medicine may also interact with the following medications:  immune globulins  live virus vaccines This list may not describe all possible interactions. Give your health care provider a list of all the medicines, herbs, non-prescription drugs, or dietary supplements you use. Also tell them if you smoke, drink alcohol, or use illegal drugs. Some items may interact with your medicine. What should I watch for while using this medicine? Visit your doctor  for check-ups as directed. Do not become pregnant for 3 months after receiving this vaccine. Women should inform their doctor if they wish to become pregnant or think they might be pregnant. There is a potential for serious side effects to an unborn child. Talk to your health care professional or pharmacist for more information. What side effects may I notice from receiving this medicine? Side effects that you should report to your doctor or health care professional as soon as possible:  allergic reactions like skin rash, itching or hives, swelling of the face, lips, or tongue  breathing problems  changes in hearing  changes in vision  difficulty walking  extreme changes in behavior  fast, irregular heartbeat  fever over 100 degrees F  pain, tingling, numbness in the hands or  feet  seizures  unusual bleeding or bruising  unusually weak or tired Side effects that usually do not require medical attention (report to your doctor or health care professional if they continue or are bothersome):  aches or pains  bruising, pain, swelling at site where injected  diarrhea  headache  low-grade fever of 100 degrees F or less  nausea, vomiting  runny nose, cough  sleepy  swollen glands This list may not describe all possible side effects. Call your doctor for medical advice about side effects. You may report side effects to FDA at 1-800-FDA-1088. Where should I keep my medicine? This drug is given in a hospital or clinic and will not be stored at home. NOTE: This sheet is a summary. It may not cover all possible information. If you have questions about this medicine, talk to your doctor, pharmacist, or health care provider.  2020 Elsevier/Gold Standard (2013-04-04 11:04:43)

## 2019-06-16 LAB — HEPATITIS B SURFACE ANTIBODY, QUANTITATIVE: Hep B S AB Quant (Post): <5 m[IU]/mL — ABNORMAL LOW (ref >=10)

## 2019-07-21 DIAGNOSIS — S0101XA Laceration without foreign body of scalp, initial encounter: Secondary | ICD-10-CM | POA: Diagnosis not present

## 2019-07-21 DIAGNOSIS — S098XXA Other specified injuries of head, initial encounter: Secondary | ICD-10-CM | POA: Diagnosis not present

## 2019-07-21 DIAGNOSIS — S0181XA Laceration without foreign body of other part of head, initial encounter: Secondary | ICD-10-CM | POA: Diagnosis not present

## 2020-06-17 ENCOUNTER — Encounter: Payer: 59 | Admitting: Internal Medicine

## 2020-08-31 ENCOUNTER — Ambulatory Visit: Payer: Commercial Managed Care - PPO | Admitting: Legal Medicine

## 2020-08-31 ENCOUNTER — Other Ambulatory Visit: Payer: Self-pay

## 2020-08-31 ENCOUNTER — Encounter: Payer: Self-pay | Admitting: Legal Medicine

## 2020-08-31 VITALS — BP 120/90 | HR 82 | Temp 98.2°F | Resp 16 | Ht 68.0 in | Wt 178.0 lb

## 2020-08-31 DIAGNOSIS — R739 Hyperglycemia, unspecified: Secondary | ICD-10-CM | POA: Diagnosis not present

## 2020-08-31 DIAGNOSIS — J3089 Other allergic rhinitis: Secondary | ICD-10-CM | POA: Insufficient documentation

## 2020-08-31 DIAGNOSIS — E559 Vitamin D deficiency, unspecified: Secondary | ICD-10-CM | POA: Diagnosis not present

## 2020-08-31 DIAGNOSIS — Z13220 Encounter for screening for lipoid disorders: Secondary | ICD-10-CM

## 2020-08-31 DIAGNOSIS — J4520 Mild intermittent asthma, uncomplicated: Secondary | ICD-10-CM | POA: Diagnosis not present

## 2020-08-31 DIAGNOSIS — J302 Other seasonal allergic rhinitis: Secondary | ICD-10-CM | POA: Insufficient documentation

## 2020-08-31 MED ORDER — TRIAMCINOLONE ACETONIDE 40 MG/ML IJ SUSP
80.0000 mg | Freq: Once | INTRAMUSCULAR | Status: AC
  Administered 2020-08-31: 80 mg via INTRAMUSCULAR

## 2020-08-31 NOTE — Progress Notes (Signed)
New Patient Office Visit  Subjective:  Patient ID: Francisco Robbins, male    DOB: 05-03-1990  Age: 30 y.o. MRN: [id number]  CC:  Chief Complaint  Patient presents with   Asthma    HPI Francisco Robbins presents for chronic persistent asthma Sick child.   Some symptoms at night 3 times a month.  But every spring he gets chronic symptoms that are persistent.2 months. Hospitalized multiple times for asthma as child.  Not in adulthood. Not on a steroid. Does not smoke. He has good exercise tolerance.  Past Medical History:  Diagnosis Date   Allergy    Asthma     Past Surgical History:  Procedure Laterality Date   APPENDECTOMY     WISDOM TOOTH EXTRACTION      Family History  Problem Relation Age of Onset   Cancer Mother        breast    Arthritis Mother    Miscarriages / [place] Mother    Diabetes Father    Alcohol abuse Father    Arthritis Father    Drug abuse Father    Hyperlipidemia Father    Hypertension Father    Kidney disease Father        died 24   Diabetes Other        mat and pat aunts/uncles with DM    Hypertension Brother    Kidney disease Maternal Grandfather    Early death Paternal Grandmother    Early death Paternal Grandfather    Diabetes Sibling    Drug abuse Sibling    Hearing loss Sibling    Miscarriages / Stillbirths Sister    Alcohol abuse Sister    Asthma Sister    Depression Sister    Alcohol abuse Brother    Asthma Brother    Drug abuse Brother    Early death Brother        4 y.o hit and run [place]    Social History   Socioeconomic History   Marital status: Married    Spouse name: Not on file   Number of children: Not on file   Years of education: Not on file   Highest education level: Not on file  Occupational History   Not on file  Tobacco Use   Smoking status: Never   Smokeless tobacco: Never  Substance and Sexual Activity   Alcohol use: Yes    Alcohol/week: 3.0 standard drinks    Types: 2 Cans of beer, 1 Shots of  liquor per week    Comment: ocasionally   Drug use: Not Currently   Sexual activity: Not Currently  Other Topics Concern   Not on file  Social History Narrative   Married    1 daughter    Works with gutters     No guns, wears seat belt, safe in relationship    HS ed    General labor    Social Determinants of Health   Financial Resource Strain: Not on file  Food Insecurity: Not on file  Transportation Needs: Not on file  Physical Activity: Not on file  Stress: Not on file  Social Connections: Not on file  Intimate Partner Violence: Not on file    ROS Review of Systems  Constitutional:  Negative for chills, fatigue and fever.  HENT:  Negative for congestion, ear pain and sore throat.   Respiratory:  Positive for cough and wheezing. Negative for shortness of breath.   Cardiovascular:  Negative for chest pain.  Gastrointestinal:  Negative for abdominal pain, constipation, diarrhea, nausea and vomiting.  Endocrine: Negative for polydipsia, polyphagia and polyuria.  Genitourinary:  Negative for dysuria and frequency.  Musculoskeletal:  Negative for arthralgias and myalgias.  Skin: Negative.   Neurological:  Negative for dizziness and headaches.  Psychiatric/Behavioral:  Negative for dysphoric mood.        No dysphoria   Objective:   Today's Vitals: BP 120/90   Pulse 82   Temp 98.2 F (36.8 C)   Resp 16   Ht 5\' 8"  (1.727 m)   Wt 178 lb (80.7 kg)   SpO2 98%   BMI 27.06 kg/m   Physical Exam Vitals reviewed.  Constitutional:      Appearance: Normal appearance.  HENT:     Head: Normocephalic and atraumatic.     Right Ear: Tympanic membrane, ear canal and external ear normal.     Left Ear: Tympanic membrane, ear canal and external ear normal.     Mouth/Throat:     Mouth: Mucous membranes are moist.     Pharynx: Oropharynx is clear.  Eyes:     Extraocular Movements: Extraocular movements intact.     Conjunctiva/sclera: Conjunctivae normal.     Pupils: Pupils are  equal, round, and reactive to light.  Cardiovascular:     Rate and Rhythm: Normal rate and regular rhythm.     Pulses: Normal pulses.     Heart sounds: Normal heart sounds. No murmur heard.   No gallop.  Pulmonary:     Effort: Pulmonary effort is normal.     Breath sounds: Normal breath sounds.  Abdominal:     General: Abdomen is flat. Bowel sounds are normal. There is no distension.     Tenderness: There is no abdominal tenderness.  Musculoskeletal:        General: Normal range of motion.     Cervical back: Normal range of motion and neck supple.  Skin:    General: Skin is warm and dry.     Capillary Refill: Capillary refill takes less than 2 seconds.  Neurological:     General: No focal deficit present.     Mental Status: He is alert and oriented to person, place, and time. Mental status is at baseline.  Psychiatric:        Mood and Affect: Mood normal.        Behavior: Behavior normal.  PFT: FVC 123%, FEV1 114%, FEV1/FVC92%, PEF 93 %  Assessment & Plan:   Diagnoses and all orders for this visit: Mild intermittent asthma without complication -     Spirometry with graph -     triamcinolone acetonide (KENALOG-40) injection 80 mg -     CBC with Differential/Platelet -     Comprehensive metabolic panel Chronic asthma that has been stable until allergy season  Vitamin D deficiency -     VITAMIN D 25 Hydroxy (Vit-D Deficiency, Fractures) Patient has history of vitamin D deficiency  Screening for hyperlipidemia -     Lipid panel Needs screening for hyperlipidemia  Hyperglycemia -     Hemoglobin A1c Strong family history for DM  Seasonal allergies  Seasonal allergies that are exacerbating asthma, give kenalog 80mg  IM  Outpatient Encounter Medications as of 08/31/2020  Medication Sig   albuterol (VENTOLIN HFA) 108 (90 Base) MCG/ACT inhaler Inhale 1-2 puffs into the lungs every 6 (six) hours as needed for wheezing or shortness of breath.   fexofenadine (ALLEGRA) 180 MG  tablet Take 1 tablet (180 mg total)  by mouth daily.   montelukast (SINGULAIR) 10 MG tablet Take 1 tablet (10 mg total) by mouth at bedtime.   VITAMIN D PO Take by mouth.   [EXPIRED] triamcinolone acetonide (KENALOG-40) injection 80 mg    No facility-administered encounter medications on file as of 08/31/2020.    Follow-up: Return in about 2 weeks (around 09/14/2020) for fasting.   Brent Bulla, MD

## 2020-09-15 ENCOUNTER — Other Ambulatory Visit: Payer: Self-pay

## 2020-09-15 ENCOUNTER — Encounter: Payer: Self-pay | Admitting: Legal Medicine

## 2020-09-15 ENCOUNTER — Ambulatory Visit (INDEPENDENT_AMBULATORY_CARE_PROVIDER_SITE_OTHER): Payer: Commercial Managed Care - PPO | Admitting: Legal Medicine

## 2020-09-15 VITALS — BP 124/80 | HR 78 | Temp 98.0°F | Resp 16 | Ht 68.0 in | Wt 176.0 lb

## 2020-09-15 DIAGNOSIS — Z0000 Encounter for general adult medical examination without abnormal findings: Secondary | ICD-10-CM | POA: Diagnosis not present

## 2020-09-15 DIAGNOSIS — Z6827 Body mass index (BMI) 27.0-27.9, adult: Secondary | ICD-10-CM | POA: Insufficient documentation

## 2020-09-15 DIAGNOSIS — Z6826 Body mass index (BMI) 26.0-26.9, adult: Secondary | ICD-10-CM

## 2020-09-15 NOTE — Patient Instructions (Signed)
Preventive Care 21-30 Years Old, Male Preventive care refers to lifestyle choices and visits with your health care provider that can promote health and wellness. This includes: A yearly physical exam. This is also called an annual wellness visit. Regular dental and eye exams. Immunizations. Screening for certain conditions. Healthy lifestyle choices, such as: Eating a healthy diet. Getting regular exercise. Not using drugs or products that contain nicotine and tobacco. Limiting alcohol use. What can I expect for my preventive care visit? Physical exam Your health care provider may check your: Height and weight. These may be used to calculate your BMI (body mass index). BMI is a measurement that tells if you are at a healthy weight. Heart rate and blood pressure. Body temperature. Skin for abnormal spots. Counseling Your health care provider may ask you questions about your: Past medical problems. Family's medical history. Alcohol, tobacco, and drug use. Emotional well-being. Home life and relationship well-being. Sexual activity. Diet, exercise, and sleep habits. Work and work environment. Access to firearms. What immunizations do I need?  Vaccines are usually given at various ages, according to a schedule. Your health care provider will recommend vaccines for you based on your age, medicalhistory, and lifestyle or other factors, such as travel or where you work. What tests do I need? Blood tests Lipid and cholesterol levels. These may be checked every 5 years starting at age 20. Hepatitis C test. Hepatitis B test. Screening  Diabetes screening. This is done by checking your blood sugar (glucose) after you have not eaten for a while (fasting). Genital exam to check for testicular cancer or hernias. STD (sexually transmitted disease) testing, if you are at risk. Talk with your health care provider about your test results, treatment options,and if necessary, the need for more  tests. Follow these instructions at home: Eating and drinking  Eat a healthy diet that includes fresh fruits and vegetables, whole grains, lean protein, and low-fat dairy products. Drink enough fluid to keep your urine pale yellow. Take vitamin and mineral supplements as recommended by your health care provider. Do not drink alcohol if your health care provider tells you not to drink. If you drink alcohol: Limit how much you have to 0-2 drinks a day. Be aware of how much alcohol is in your drink. In the [place] one drink equals one 12 oz bottle of beer (355 mL), one 5 oz glass of wine (148 mL), or one 1 oz glass of hard liquor (44 mL).  Lifestyle Take daily care of your teeth and gums. Brush your teeth every morning and night with fluoride toothpaste. Floss one time each day. Stay active. Exercise for at least 30 minutes 5 or more days each week. Do not use any products that contain nicotine or tobacco, such as cigarettes, e-cigarettes, and chewing tobacco. If you need help quitting, ask your health care provider. Do not use drugs. If you are sexually active, practice safe sex. Use a condom or other form of protection to prevent STIs (sexually transmitted infections). Find healthy ways to cope with stress, such as: Meditation, yoga, or listening to music. Journaling. Talking to a trusted person. Spending time with friends and family. Safety Always wear your seat belt while driving or riding in a vehicle. Do not drive: If you have been drinking alcohol. Do not ride with someone who has been drinking. When you are tired or distracted. While texting. Wear a helmet and other protective equipment during sports activities. If you have firearms in your house, make sure   you follow all gun safety procedures. Seek help if you have been physically or sexually abused. What's next? Go to your health care provider once a year for an annual wellness visit. Ask your health care provider how often  you should have your eyes and teeth checked. Stay up to date on all vaccines. This information is not intended to replace advice given to you by your health care provider. Make sure you discuss any questions you have with your healthcare provider. Document Revised: 11/20/2018 Document Reviewed: 02/28/2018 Elsevier Patient Education  2022 Elsevier Inc.  

## 2020-09-15 NOTE — Progress Notes (Signed)
Subjective:  Patient ID: Francisco Robbins, male    DOB: [birth year]  Age: 30 y.o. MRN: [id number]  Chief Complaint  Patient presents with   Annual Exam    HPI  Well Adult Physical: Patient here for a comprehensive physical exam.The patient reports problems - asthma Do you take any herbs or supplements that were not prescribed by a doctor? no Are you taking calcium supplements? no Are you taking aspirin daily? no  Encounter for general adult medical examination without abnormal findings  Physical ("At Risk" items are starred): Patient's last physical exam was 1 year ago .  Smoking: Life-long non-smoker ;  Physical Activity: Exercises at least 3 times per week ;  Alcohol/Drug Use: Is a non-drinker ; No illicit drug use ;  Patient is not afflicted from Stress Incontinence and Urge Incontinence  Safety: reviewed ; Patient wears a seat belt, has smoke detectors, has carbon monoxide detectors, practices appropriate gun safety, and wears sunscreen with extended sun exposure. Dental Care: biannual cleanings, brushes and flosses daily. Ophthalmology/Optometry: Annual visit.  Hearing loss: none Vision impairments: none Last PSA:  Flowsheet Row Office Visit from 09/15/2020 in [facility]  PHQ-2 Total Score 0               Social History   Socioeconomic History   Marital status: Married    Spouse name: Not on file   Number of children: Not on file   Years of education: Not on file   Highest education level: Not on file  Occupational History   Not on file  Tobacco Use   Smoking status: Never   Smokeless tobacco: Never  Substance and Sexual Activity   Alcohol use: Yes    Alcohol/week: 3.0 standard drinks    Types: 2 Cans of beer, 1 Shots of liquor per week    Comment: ocasionally   Drug use: Not Currently   Sexual activity: Not Currently  Other Topics Concern   Not on file  Social History Narrative   Married    1 daughter    Works with gutters     No guns, wears seat  belt, safe in relationship    HS ed    General labor    Social Determinants of Health   Financial Resource Strain: Not on file  Food Insecurity: Not on file  Transportation Needs: Not on file  Physical Activity: Not on file  Stress: Not on file  Social Connections: Not on file   Past Medical History:  Diagnosis Date   Allergy    Asthma    Past Surgical History:  Procedure Laterality Date   APPENDECTOMY     WISDOM TOOTH EXTRACTION      Family History  Problem Relation Age of Onset   Cancer Mother        breast    Arthritis Mother    Miscarriages / [place] Mother    Diabetes Father    Alcohol abuse Father    Arthritis Father    Drug abuse Father    Hyperlipidemia Father    Hypertension Father    Kidney disease Father        died 19   Diabetes Other        mat and pat aunts/uncles with DM    Hypertension Brother    Kidney disease Maternal Grandfather    Early death Paternal Grandmother    Early death Paternal Grandfather    Diabetes Sibling    Drug abuse Sibling  Hearing loss Sibling    Miscarriages / [place] Sister    Alcohol abuse Sister    Asthma Sister    Depression Sister    Alcohol abuse Brother    Asthma Brother    Drug abuse Brother    Early death Brother        70 y.o hit and run [place] 65   Social History   Socioeconomic History   Marital status: Married    Spouse name: Not on file   Number of children: Not on file   Years of education: Not on file   Highest education level: Not on file  Occupational History   Not on file  Tobacco Use   Smoking status: Never   Smokeless tobacco: Never  Substance and Sexual Activity   Alcohol use: Yes    Alcohol/week: 3.0 standard drinks    Types: 2 Cans of beer, 1 Shots of liquor per week    Comment: ocasionally   Drug use: Not Currently   Sexual activity: Not Currently  Other Topics Concern   Not on file  Social History Narrative   Married    1 daughter    Works with gutters      No guns, wears seat belt, safe in relationship    HS ed    General labor    Social Determinants of Health   Financial Resource Strain: Not on file  Food Insecurity: Not on file  Transportation Needs: Not on file  Physical Activity: Not on file  Stress: Not on file  Social Connections: Not on file   Review of Systems  Constitutional:  Negative for chills, fatigue and fever.  HENT:  Negative for congestion, ear pain and sore throat.   Respiratory:  Negative for cough and shortness of breath.   Cardiovascular:  Negative for chest pain.  Gastrointestinal:  Negative for abdominal pain, constipation, diarrhea, nausea and vomiting.  Endocrine: Negative for polydipsia, polyphagia and polyuria.  Genitourinary:  Negative for dysuria and frequency.  Musculoskeletal:  Negative for arthralgias and myalgias.  Neurological:  Negative for dizziness and headaches.  Psychiatric/Behavioral:  Negative for dysphoric mood.        No dysphoria    Objective:  BP 124/80   Pulse 78   Temp 98 F (36.7 C)   Resp 16   Ht 5\' 8"  (1.727 m)   Wt 176 lb (79.8 kg)   SpO2 98%   BMI 26.76 kg/m   BP/Weight 09/15/2020 08/31/2020 06/13/2019  Systolic BP 124 120 122  Diastolic BP 80 90 84  Wt. (Lbs) 176 178 191.2  BMI 26.76 27.06 29.07    Physical Exam  Lab Results  Component Value Date   WBC 4.9 06/13/2019   HGB 15.3 06/13/2019   HCT 43.9 06/13/2019   PLT 169.0 06/13/2019   GLUCOSE 104 (H) 06/13/2019   CHOL 186 06/13/2019   TRIG 149.0 06/13/2019   HDL 36.80 (L) 06/13/2019   LDLCALC 119 (H) 06/13/2019   ALT 35 06/13/2019   AST 23 06/13/2019   NA 136 06/13/2019   K 3.8 06/13/2019   CL 103 06/13/2019   CREATININE 0.89 06/13/2019   BUN 13 06/13/2019   CO2 29 06/13/2019   TSH 2.32 06/13/2019      Assessment & Plan:  Diagnoses and all orders for this visit: Routine general medical examination at a health care facility -     CBC with Differential/Platelet -     Comprehensive metabolic  panel -  Lipid panel -     Hemoglobin A1c Normal exam, check routine labs BMI 26.0-26.9,adult  Mildly over weight, increase activity and watch carbs  Body mass index is 26.76 kg/m.   These are the goals we discussed:  Goals      Increase physical activity         This is a list of the screening recommended for you and due dates:  Health Maintenance  Topic Date Due   HIV Screening  Never done   Hepatitis C Screening: USPSTF Recommendation to screen - Ages 26-79 yo.  Never done   COVID-19 Vaccine (1) 09/16/2021*   Flu Shot  10/18/2020   Tetanus Vaccine  06/11/2028   Pneumococcal Vaccination  Aged Out   HPV Vaccine  Aged Out  *Topic was postponed. The date shown is not the original due date.     AN INDIVIDUALIZED CARE PLAN: was established or reinforced today.   SELF MANAGEMENT: The patient and I together assessed ways to personally work towards obtaining the recommended goals  Support needs The patient and/or family needs were assessed and services were offered if appropriate.      Follow-up: Return if symptoms worsen or fail to improve.  An After Visit Summary was printed and given to the patient.  Brent Bulla, MD [facility] [phone]

## 2020-09-16 LAB — CBC WITH DIFFERENTIAL/PLATELET
Basophils Absolute: 0 10*3/uL (ref 0.0–0.2)
Basos: 1 %
EOS (ABSOLUTE): 0.1 10*3/uL (ref 0.0–0.4)
Eos: 2 %
Hematocrit: 46.7 % (ref 37.5–51.0)
Hemoglobin: 15.4 g/dL (ref 13.0–17.7)
Immature Grans (Abs): 0 10*3/uL (ref 0.0–0.1)
Immature Granulocytes: 0 %
Lymphocytes Absolute: 1.4 10*3/uL (ref 0.7–3.1)
Lymphs: 25 %
MCH: 30.6 pg (ref 26.6–33.0)
MCHC: 33 g/dL (ref 31.5–35.7)
MCV: 93 fL (ref 79–97)
Monocytes Absolute: 0.4 10*3/uL (ref 0.1–0.9)
Monocytes: 8 %
Neutrophils Absolute: 3.5 10*3/uL (ref 1.4–7.0)
Neutrophils: 64 %
Platelets: 179 10*3/uL (ref 150–450)
RBC: 5.04 x10E6/uL (ref 4.14–5.80)
RDW: 13.1 % (ref 11.6–15.4)
WBC: 5.5 10*3/uL (ref 3.4–10.8)

## 2020-09-16 LAB — LIPID PANEL
Chol/HDL Ratio: 3.4 ratio (ref 0.0–5.0)
Cholesterol, Total: 180 mg/dL (ref 100–199)
HDL: 53 mg/dL (ref >39)
LDL Chol Calc (NIH): 118 mg/dL — ABNORMAL HIGH (ref 0–99)
Triglycerides: 48 mg/dL (ref 0–149)
VLDL Cholesterol Cal: 9 mg/dL (ref 5–40)

## 2020-09-16 LAB — COMPREHENSIVE METABOLIC PANEL
ALT: 17 IU/L (ref 0–44)
AST: 15 IU/L (ref 0–40)
Albumin/Globulin Ratio: 1.8 (ref 1.2–2.2)
Albumin: 4.4 g/dL (ref 4.1–5.2)
Alkaline Phosphatase: 112 IU/L (ref 44–121)
BUN/Creatinine Ratio: 16 (ref 9–20)
BUN: 15 mg/dL (ref 6–20)
Bilirubin Total: 0.3 mg/dL (ref 0.0–1.2)
CO2: 25 mmol/L (ref 20–29)
Calcium: 9 mg/dL (ref 8.7–10.2)
Chloride: 102 mmol/L (ref 96–106)
Creatinine, Ser: 0.93 mg/dL (ref 0.76–1.27)
Globulin, Total: 2.4 g/dL (ref 1.5–4.5)
Glucose: 93 mg/dL (ref 65–99)
Potassium: 4.5 mmol/L (ref 3.5–5.2)
Sodium: 139 mmol/L (ref 134–144)
Total Protein: 6.8 g/dL (ref 6.0–8.5)
eGFR: 113 mL/min/{1.73_m2} (ref >59)

## 2020-09-16 LAB — CARDIOVASCULAR RISK ASSESSMENT

## 2020-09-16 LAB — HEMOGLOBIN A1C
Est. average glucose Bld gHb Est-mCnc: 103 mg/dL
Hgb A1c MFr Bld: 5.2 % (ref 4.8–5.6)

## 2020-09-16 NOTE — Progress Notes (Signed)
Cbc normal, kidney and liver tests normal, LDLc is high 118, A1c 5.2 normal- start dash diet lp

## 2021-05-24 ENCOUNTER — Other Ambulatory Visit: Payer: Self-pay

## 2021-05-24 ENCOUNTER — Encounter: Payer: Self-pay | Admitting: Legal Medicine

## 2021-05-24 ENCOUNTER — Ambulatory Visit: Payer: Commercial Managed Care - PPO | Admitting: Legal Medicine

## 2021-05-24 VITALS — BP 108/80 | HR 80 | Temp 97.4°F | Ht 68.0 in | Wt 180.0 lb

## 2021-05-24 DIAGNOSIS — J302 Other seasonal allergic rhinitis: Secondary | ICD-10-CM

## 2021-05-24 MED ORDER — TRIAMCINOLONE ACETONIDE 40 MG/ML IJ SUSP
80.0000 mg | Freq: Once | INTRAMUSCULAR | Status: AC
  Administered 2021-05-24: 80 mg via INTRAMUSCULAR

## 2021-05-24 NOTE — Progress Notes (Signed)
? ?Acute Office Visit ? ?Subjective:  ? ? Patient ID: Francisco Robbins, male    DOB: September 23, 1990, 31 y.o.   MRN: [id number] ? ?Chief Complaint  ?Patient presents with  ? Allergies  ? ? ?HPI: ?Patient is in today for allergies. Patient states he would like to have another kenalog injection. Denies any allergy symptoms at this time. ? ?Patient has 2 cousins with recently diagnosed colon cancer , one stage 61, ages 66 and 4, we discussed early colon screening since he is at increased risk. ? ?Past Medical History:  ?Diagnosis Date  ? Allergy   ? Asthma   ? ? ?Past Surgical History:  ?Procedure Laterality Date  ? APPENDECTOMY    ? WISDOM TOOTH EXTRACTION    ? ? ?Family History  ?Problem Relation Age of Onset  ? Cancer Mother   ?     breast   ? Arthritis Mother   ? Miscarriages / [place] Mother   ? Diabetes Father   ? Alcohol abuse Father   ? Arthritis Father   ? Drug abuse Father   ? Hyperlipidemia Father   ? Hypertension Father   ? Kidney disease Father   ?     died 9  ? Diabetes Other   ?     mat and pat aunts/uncles with DM   ? Hypertension Brother   ? Kidney disease Maternal Grandfather   ? Early death Paternal Grandmother   ? Early death Paternal Grandfather   ? Diabetes Sibling   ? Drug abuse Sibling   ? Hearing loss Sibling   ? Miscarriages / Stillbirths Sister   ? Alcohol abuse Sister   ? Asthma Sister   ? Depression Sister   ? Alcohol abuse Brother   ? Asthma Brother   ? Drug abuse Brother   ? Early death Brother   ?     75 y.o hit and run [place] 18  ? ? ?Social History  ? ?Socioeconomic History  ? Marital status: Married  ?  Spouse name: Not on file  ? Number of children: Not on file  ? Years of education: Not on file  ? Highest education level: Not on file  ?Occupational History  ? Not on file  ?Tobacco Use  ? Smoking status: Never  ? Smokeless tobacco: Never  ?Substance and Sexual Activity  ? Alcohol use: Yes  ?  Alcohol/week: 3.0 standard drinks  ?  Types: 2 Cans of beer, 1 Shots of liquor per week  ?   Comment: ocasionally  ? Drug use: Not Currently  ? Sexual activity: Not Currently  ?Other Topics Concern  ? Not on file  ?Social History Narrative  ? Married   ? 1 daughter   ? Works with gutters    ? No guns, wears seat belt, safe in relationship   ? HS ed   ? General labor   ? ?Social Determinants of Health  ? ?Financial Resource Strain: Not on file  ?Food Insecurity: Not on file  ?Transportation Needs: Not on file  ?Physical Activity: Not on file  ?Stress: Not on file  ?Social Connections: Not on file  ?Intimate Partner Violence: Not on file  ? ? ?Outpatient Medications Prior to Visit  ?Medication Sig Dispense Refill  ? VITAMIN D PO Take by mouth.    ? ?No facility-administered medications prior to visit.  ? ? ?No Known Allergies ? ?Review of Systems  ?Constitutional:  Negative for chills, fatigue and fever.  ?HENT:  Negative for congestion, ear pain, postnasal drip, rhinorrhea, sinus pressure, sinus pain, sneezing and sore throat.   ?Respiratory:  Negative for cough and shortness of breath.   ?Cardiovascular:  Negative for chest pain.  ?Gastrointestinal:  Negative for diarrhea, nausea and vomiting.  ?Neurological:  Negative for dizziness and headaches.  ? ?   ?Objective:  ?  ?Physical Exam ?Vitals reviewed.  ?Constitutional:   ?   Appearance: Normal appearance.  ?HENT:  ?   Head: Normocephalic.  ?   Right Ear: Tympanic membrane normal.  ?   Left Ear: Tympanic membrane normal.  ?   Nose: Congestion present.  ?Eyes:  ?   Pupils: Pupils are equal, round, and reactive to light.  ?   Comments: [place] of conjunctiva  ?Cardiovascular:  ?   Rate and Rhythm: Normal rate and regular rhythm.  ?   Pulses: Normal pulses.  ?   Heart sounds: Normal heart sounds. No murmur heard. ?  No gallop.  ?Pulmonary:  ?   Effort: Pulmonary effort is normal. No respiratory distress.  ?   Breath sounds: Normal breath sounds. No wheezing.  ?Abdominal:  ?   General: Abdomen is flat. Bowel sounds are normal. There is no distension.  ?    Tenderness: There is no abdominal tenderness.  ?Musculoskeletal:     ?   General: Normal range of motion.  ?   Cervical back: Normal range of motion and neck supple.  ?Skin: ?   General: Skin is warm.  ?   Capillary Refill: Capillary refill takes less than 2 seconds.  ?Neurological:  ?   General: No focal deficit present.  ?   Mental Status: He is alert and oriented to person, place, and time.  ? ? ?BP 108/80   Pulse 80   Temp (!) 97.4 ?F (36.3 ?C)   Ht _0  (1.727 m)   Wt 180 lb (81.6 kg)   SpO2 98%   BMI 27.37 kg/m?  ?Wt Readings from Last 3 Encounters:  ?05/24/21 180 lb (81.6 kg)  ?09/15/20 176 lb (79.8 kg)  ?08/31/20 178 lb (80.7 kg)  ? ? ?Health Maintenance Due  ?Topic Date Due  ? HIV Screening  Never done  ? Hepatitis C Screening  Never done  ? INFLUENZA VACCINE  Never done  ? ? ?There are no preventive care reminders to display for this patient. ? ? ?Lab Results  ?Component Value Date  ? TSH 2.32 06/13/2019  ? ?Lab Results  ?Component Value Date  ? WBC 5.5 09/15/2020  ? HGB 15.4 09/15/2020  ? HCT 46.7 09/15/2020  ? MCV 93 09/15/2020  ? PLT 179 09/15/2020  ? ?Lab Results  ?Component Value Date  ? NA 139 09/15/2020  ? K 4.5 09/15/2020  ? CO2 25 09/15/2020  ? GLUCOSE 93 09/15/2020  ? BUN 15 09/15/2020  ? CREATININE 0.93 09/15/2020  ? BILITOT 0.3 09/15/2020  ? ALKPHOS 112 09/15/2020  ? AST 15 09/15/2020  ? ALT 17 09/15/2020  ? PROT 6.8 09/15/2020  ? ALBUMIN 4.4 09/15/2020  ? CALCIUM 9.0 09/15/2020  ? EGFR 113 09/15/2020  ? GFR 101.06 06/13/2019  ? ?Lab Results  ?Component Value Date  ? CHOL 180 09/15/2020  ? ?Lab Results  ?Component Value Date  ? HDL 53 09/15/2020  ? ?Lab Results  ?Component Value Date  ? [place] 118 (H) 09/15/2020  ? ?Lab Results  ?Component Value Date  ? TRIG 48 09/15/2020  ? ?Lab Results  ?Component Value Date  ?  CHOLHDL 3.4 09/15/2020  ? ?Lab Results  ?Component Value Date  ? HGBA1C 5.2 09/15/2020  ? ? ?   ?Assessment & Plan:  ? ?Diagnoses and all orders for this visit: ?Seasonal  allergies ?-     triamcinolone acetonide (KENALOG-40) injection 80 mg ?Patient has severe seasonal allergies with asthma, treat with kenalog.  He performs outdoor work ?We discussed early colonoscopy with his high risk family history- he will let us know to set up ?  ? ? ?  ? ?Follow-up: Return if symptoms worsen or fail to improve. ? ?An After Visit Summary was printed and given to the patient. ? ?Reinaldo Meeker, MD ?[place] ?[phone]

## 2022-05-29 ENCOUNTER — Encounter: Payer: Commercial Managed Care - PPO | Admitting: Nurse Practitioner

## 2022-06-04 NOTE — Progress Notes (Signed)
Subjective:  Patient ID: Elise Loris, male    DOB: 1990-06-28  Age: 32 y.o. MRN: [id number]  Chief Complaints:  History of Present illness:  Well Adult Physical: Patient here for a comprehensive physical exam.The patient reports {problems:16946} Do you take any herbs or supplements that were not prescribed by a doctor? {yes/no/not asked:9010} Are you taking calcium supplements? {yes/no:63} Are you taking aspirin daily? {yes/no:63}  Encounter for general adult medical examination without abnormal findings  Physical ("At Risk" items are starred): Patient's last physical exam was 1 year ago .  Patient wears a seat belt, has smoke detectors, has carbon monoxide detectors, practices appropriate gun safety, and wears sunscreen with extended sun exposure. Dental Care: biannual cleanings, brushes and flosses daily. Ophthalmology/Optometry: Annual visit.  Hearing loss: none Vision impairments: none Last PSA:  [place] Office Visit from 09/15/2020 in [place]  PHQ-2 Total Score 0               Social History   Socioeconomic History   Marital status: Married    Spouse name: Not on file   Number of children: Not on file   Years of education: Not on file   Highest education level: Not on file  Occupational History   Not on file  Tobacco Use   Smoking status: Never   Smokeless tobacco: Never  Substance and Sexual Activity   Alcohol use: Yes    Alcohol/week: 3.0 standard drinks of alcohol    Types: 2 Cans of beer, 1 Shots of liquor per week    Comment: ocasionally   Drug use: Not Currently   Sexual activity: Not Currently  Other Topics Concern   Not on file  Social History Narrative   Married    1 daughter    Works with gutters     No guns, wears seat belt, safe in relationship    HS ed    General labor    Social Determinants of Health   Financial Resource Strain: Not on file  Food Insecurity: Not on file  Transportation Needs: Not on file   Physical Activity: Not on file  Stress: Not on file  Social Connections: Not on file   Past Medical History:  Diagnosis Date   Allergy    Asthma    Past Surgical History:  Procedure Laterality Date   APPENDECTOMY     WISDOM TOOTH EXTRACTION      Family History  Problem Relation Age of Onset   Cancer Mother        breast    Arthritis Mother    Miscarriages / [place] Mother    Diabetes Father    Alcohol abuse Father    Arthritis Father    Drug abuse Father    Hyperlipidemia Father    Hypertension Father    Kidney disease Father        died 31   Diabetes Other        mat and pat aunts/uncles with DM    Hypertension Brother    Kidney disease Maternal Grandfather    Early death Paternal Grandmother    Early death Paternal Grandfather    Diabetes Sibling    Drug abuse Sibling    Hearing loss Sibling    Miscarriages / Stillbirths Sister    Alcohol abuse Sister    Asthma Sister    Depression Sister    Alcohol abuse Brother    Asthma Brother    Drug abuse Brother  Early death Brother        54 y.o hit and run [place] History   Marital status: Married    Spouse name: Not on file   Number of children: Not on file   Years of education: Not on file   Highest education level: Not on file  Occupational History   Not on file  Tobacco Use   Smoking status: Never   Smokeless tobacco: Never  Substance and Sexual Activity   Alcohol use: Yes    Alcohol/week: 3.0 standard drinks of alcohol    Types: 2 Cans of beer, 1 Shots of liquor per week    Comment: ocasionally   Drug use: Not Currently   Sexual activity: Not Currently  Other Topics Concern   Not on file  Social History Narrative   Married    1 daughter    Works with gutters     No guns, wears seat belt, safe in relationship    HS ed    General labor    Social Determinants of Health   Financial Resource Strain: Not on file  Food Insecurity: Not on file   Transportation Needs: Not on file  Physical Activity: Not on file  Stress: Not on file  Social Connections: Not on file   Review of Systems   Objective:  There were no vitals taken for this visit.     05/24/2021   10:19 AM 09/15/2020    9:58 AM 08/31/2020    3:17 PM  BP/Weight  Systolic BP [id number]  Diastolic BP 80 80 90  Wt. (Lbs) 180 176 178  BMI 27.37 kg/m2 26.76 kg/m2 27.06 kg/m2    Physical Exam  Lab Results  Component Value Date   WBC 5.5 09/15/2020   HGB 15.4 09/15/2020   HCT 46.7 09/15/2020   PLT 179 09/15/2020   GLUCOSE 93 09/15/2020   CHOL 180 09/15/2020   TRIG 48 09/15/2020   HDL 53 09/15/2020   LDLCALC 118 (H) 09/15/2020   ALT 17 09/15/2020   AST 15 09/15/2020   NA 139 09/15/2020   K 4.5 09/15/2020   CL 102 09/15/2020   CREATININE 0.93 09/15/2020   BUN 15 09/15/2020   CO2 25 09/15/2020   TSH 2.32 06/13/2019   HGBA1C 5.2 09/15/2020      Assessment & Plan:     There is no height or weight on file to calculate BMI.   These are the goals we discussed:  Goals      Increase physical activity         This is a list of the screening recommended for you and due dates:  Health Maintenance  Topic Date Due   COVID-19 Vaccine (1) Never done   HIV Screening  Never done   Hepatitis C Screening: USPSTF Recommendation to screen - Ages 78-79 yo.  Never done   Flu Shot  Never done   DTaP/Tdap/Td vaccine (2 - Td or Tdap) 06/11/2028   HPV Vaccine  Aged Out     No orders of the defined types were placed in this encounter.    Follow-up: No follow-ups on file.   An After Visit Summary was printed and given to the patient.  Neil Crouch, DNP, [place] [phone]

## 2022-06-05 ENCOUNTER — Encounter: Payer: Self-pay | Admitting: Nurse Practitioner

## 2022-06-05 ENCOUNTER — Ambulatory Visit (INDEPENDENT_AMBULATORY_CARE_PROVIDER_SITE_OTHER): Payer: Commercial Managed Care - PPO | Admitting: Nurse Practitioner

## 2022-06-05 VITALS — BP 110/80 | HR 72 | Temp 97.1°F | Resp 14 | Ht 68.0 in | Wt 175.0 lb

## 2022-06-05 DIAGNOSIS — E782 Mixed hyperlipidemia: Secondary | ICD-10-CM | POA: Insufficient documentation

## 2022-06-05 DIAGNOSIS — J4520 Mild intermittent asthma, uncomplicated: Secondary | ICD-10-CM | POA: Diagnosis not present

## 2022-06-05 DIAGNOSIS — J301 Allergic rhinitis due to pollen: Secondary | ICD-10-CM

## 2022-06-05 MED ORDER — TRIAMCINOLONE ACETONIDE 40 MG/ML IJ SUSP
60.0000 mg | Freq: Once | INTRAMUSCULAR | Status: AC
  Administered 2022-06-05: 60 mg via INTRAMUSCULAR

## 2022-06-05 MED ORDER — ALBUTEROL SULFATE HFA 108 (90 BASE) MCG/ACT IN AERS: 2.0000 | INHALATION_SPRAY | Freq: Four times a day (QID) | RESPIRATORY_TRACT | 0 refills | Status: DC | PRN

## 2022-06-05 NOTE — Assessment & Plan Note (Signed)
Controlled with diet and exercise  Nutrition: Stressed importance of moderation in sodium intake, saturated fat and cholesterol, caloric balance, sufficient intake of complex carbohydrates, fiber, calcium and iron.   Exercise: Stressed the importance of regular exercise.    

## 2022-06-05 NOTE — Assessment & Plan Note (Signed)
Asthma well controlled Kenalog shot given in the clinic Advised to avoid triggers  Continue Claritin D daily  Continue Albuterol puff as needed

## 2022-06-05 NOTE — Assessment & Plan Note (Signed)
Kenalog shot given in the clinic Advised to avoid triggers  Continue Claritin D daily  Continue Albuterol puff as needed

## 2022-06-05 NOTE — Patient Instructions (Signed)
Follow up as needed Will call with lab results  Allergic Rhinitis, Adult  Allergic rhinitis is an allergic reaction that affects the mucous membrane inside the nose. The mucous membrane is the tissue that produces mucus. There are two types of allergic rhinitis: Seasonal. This type is also called hay fever and happens only during certain seasons. Perennial. This type can happen at any time of the year. Allergic rhinitis cannot be spread from person to person. This condition can be mild, bad, or very bad. It can develop at any age and may be outgrown. What are the causes? This condition is caused by allergens. These are things that can cause an allergic reaction. Allergens may differ for seasonal allergic rhinitis and perennial allergic rhinitis. Seasonal allergic rhinitis is caused by pollen. Pollen can come from grasses, trees, and weeds. Perennial allergic rhinitis may be caused by: Dust mites. Proteins in a pet's pee (urine), saliva, or dander. Dander is dead skin cells from a pet. Smoke, mold, or car fumes. Remains of or waste from insects such as cockroaches. What increases the risk? You are more likely to develop this condition if you have a family history of allergies or other conditions related to allergies, including: Allergic conjunctivitis. This is irritation and swelling of parts of the eyes and eyelids. Asthma. This condition affects the lungs and makes it hard to breathe. Atopic dermatitis or eczema. This is long term (chronic) irritation and swelling of the skin. Food allergies. What are the signs or symptoms? Symptoms of this condition include: Sneezing or coughing. A stuffy nose (nasal congestion), itchy nose, or nasal discharge. Itchy eyes and tearing of the eyes. A feeling of mucus dripping down the back of your throat (postnasal drip). This may cause a sore throat. Trouble sleeping. Tiredness. Headache. How is this diagnosed? This condition may be diagnosed with  your symptoms, your medical history, and a physical exam. Your health care provider may check for related conditions, such as: Asthma. Pink eye. This is eye swelling and irritation caused by infection (conjunctivitis). Ear infection. Upper respiratory infection. This is an infection in the nose, throat, or upper airways. You may also have tests to find out which allergens cause your symptoms. These may include skin tests or blood tests. How is this treated? There is no cure for this condition, but treatment can help control symptoms. Treatment may include: Taking medicines that block allergy symptoms, such as corticosteroids (anti-inflammatories) and antihistamines. Medicine may be given as a shot, nasal spray, or pill. Avoiding any allergens. Being exposed again and again to tiny amounts of allergens to help you build a defense against allergens (allergenimmunotherapy). This is done if other treatments have not helped. It may include: Allergy shots. These are injected medicines that have small amounts of an allergen in them. Sublingual immunotherapy. This involves taking small doses of a medicine with an allergen in it under your tongue. If these treatments do not work, your provider may prescribe newer, stronger medicines. Follow these instructions at home: Avoiding allergens Find out what you are allergic to and avoid those allergens. These are some things you can do to help avoid allergens: If you have perennial allergies: Replace carpet with wood, tile, or vinyl flooring. Carpet can trap dander and dust. Do not smoke. Do not allow smoking in your home Change your heating and air conditioning filters at least once a month. If you have seasonal allergies, take these steps during allergy season: Keep windows closed as much as possible. Plan outdoor  activities when pollen counts are lowest. Check pollen counts before you plan outdoor activities When coming indoors, change clothing and shower  before sitting on furniture or bedding. If you have a pet in the house that produces allergens: Keep the pet out of the bedroom. Vacuum, sweep, and dust regularly. General instructions Take over-the-counter and prescription medicines only as told by your provider. Drink enough fluid to keep your pee pale yellow. Where to find more information American Academy of Allergy, Asthma & Immunology: aaaai.org Contact a health care provider if: You have a fever. You develop a cough that does not go away. You make high-pitched whistling sounds when you breathe, most often when you breathe out (wheeze). Your symptoms slow you down or stop you from doing your normal activities each day. Get help right away if: You have shortness of breath. This symptom may be an emergency. Get help right away. Call 911. Do not wait to see if the symptoms will go away. Do not drive yourself to the hospital. This information is not intended to replace advice given to you by your health care provider. Make sure you discuss any questions you have with your health care provider. Document Revised: 11/14/2021 Document Reviewed: 11/14/2021 Elsevier Patient Education  [employer].

## 2022-06-06 LAB — CBC WITH DIFFERENTIAL/PLATELET
Basophils Absolute: 0 10*3/uL (ref 0.0–0.2)
Basos: 1 %
EOS (ABSOLUTE): 0.3 10*3/uL (ref 0.0–0.4)
Eos: 7 %
Hematocrit: 43.3 % (ref 37.5–51.0)
Hemoglobin: 14.7 g/dL (ref 13.0–17.7)
Immature Grans (Abs): 0 10*3/uL (ref 0.0–0.1)
Immature Granulocytes: 0 %
Lymphocytes Absolute: 1.3 10*3/uL (ref 0.7–3.1)
Lymphs: 31 %
MCH: 30.6 pg (ref 26.6–33.0)
MCHC: 33.9 g/dL (ref 31.5–35.7)
MCV: 90 fL (ref 79–97)
Monocytes Absolute: 0.2 10*3/uL (ref 0.1–0.9)
Monocytes: 6 %
Neutrophils Absolute: 2.4 10*3/uL (ref 1.4–7.0)
Neutrophils: 55 %
Platelets: 201 10*3/uL (ref 150–450)
RBC: 4.81 x10E6/uL (ref 4.14–5.80)
RDW: 13 % (ref 11.6–15.4)
WBC: 4.2 10*3/uL (ref 3.4–10.8)

## 2022-06-06 LAB — COMPREHENSIVE METABOLIC PANEL
ALT: 22 IU/L (ref 0–44)
AST: 21 IU/L (ref 0–40)
Albumin/Globulin Ratio: 1.8 (ref 1.2–2.2)
Albumin: 4.5 g/dL (ref 4.1–5.1)
Alkaline Phosphatase: 88 IU/L (ref 44–121)
BUN/Creatinine Ratio: 14 (ref 9–20)
BUN: 12 mg/dL (ref 6–20)
Bilirubin Total: 0.4 mg/dL (ref 0.0–1.2)
CO2: 23 mmol/L (ref 20–29)
Calcium: 9.2 mg/dL (ref 8.7–10.2)
Chloride: 103 mmol/L (ref 96–106)
Creatinine, Ser: 0.86 mg/dL (ref 0.76–1.27)
Globulin, Total: 2.5 g/dL (ref 1.5–4.5)
Glucose: 100 mg/dL — ABNORMAL HIGH (ref 70–99)
Potassium: 4.1 mmol/L (ref 3.5–5.2)
Sodium: 139 mmol/L (ref 134–144)
Total Protein: 7 g/dL (ref 6.0–8.5)
eGFR: 119 mL/min/{1.73_m2} (ref >59)

## 2022-06-06 LAB — LIPID PANEL
Chol/HDL Ratio: 3.5 ratio (ref 0.0–5.0)
Cholesterol, Total: 156 mg/dL (ref 100–199)
HDL: 44 mg/dL (ref >39)
LDL Chol Calc (NIH): 97 mg/dL (ref 0–99)
Triglycerides: 81 mg/dL (ref 0–149)
VLDL Cholesterol Cal: 15 mg/dL (ref 5–40)

## 2022-06-06 LAB — CARDIOVASCULAR RISK ASSESSMENT

## 2023-06-06 ENCOUNTER — Ambulatory Visit (INDEPENDENT_AMBULATORY_CARE_PROVIDER_SITE_OTHER): Payer: Commercial Managed Care - PPO | Admitting: Physician Assistant

## 2023-06-06 ENCOUNTER — Encounter: Payer: Self-pay | Admitting: Physician Assistant

## 2023-06-06 VITALS — BP 110/72 | HR 72 | Temp 97.9°F | Ht 68.0 in | Wt 182.0 lb

## 2023-06-06 DIAGNOSIS — Z131 Encounter for screening for diabetes mellitus: Secondary | ICD-10-CM | POA: Diagnosis not present

## 2023-06-06 DIAGNOSIS — Z8379 Family history of other diseases of the digestive system: Secondary | ICD-10-CM | POA: Diagnosis not present

## 2023-06-06 DIAGNOSIS — Z0000 Encounter for general adult medical examination without abnormal findings: Secondary | ICD-10-CM

## 2023-06-06 DIAGNOSIS — J3089 Other allergic rhinitis: Secondary | ICD-10-CM

## 2023-06-06 DIAGNOSIS — K580 Irritable bowel syndrome with diarrhea: Secondary | ICD-10-CM

## 2023-06-06 DIAGNOSIS — Z1211 Encounter for screening for malignant neoplasm of colon: Secondary | ICD-10-CM

## 2023-06-06 DIAGNOSIS — Z1212 Encounter for screening for malignant neoplasm of rectum: Secondary | ICD-10-CM

## 2023-06-06 LAB — COMPREHENSIVE METABOLIC PANEL
ALT: 53 IU/L — ABNORMAL HIGH (ref 0–44)
AST: 37 IU/L (ref 0–40)
Albumin: 4.8 g/dL (ref 4.1–5.1)
Alkaline Phosphatase: 97 IU/L (ref 44–121)
BUN/Creatinine Ratio: 16 (ref 9–20)
BUN: 16 mg/dL (ref 6–20)
Bilirubin Total: 0.7 mg/dL (ref 0.0–1.2)
CO2: 22 mmol/L (ref 20–29)
Calcium: 9.5 mg/dL (ref 8.7–10.2)
Chloride: 100 mmol/L (ref 96–106)
Creatinine, Ser: 0.98 mg/dL (ref 0.76–1.27)
Globulin, Total: 2.7 g/dL (ref 1.5–4.5)
Glucose: 102 mg/dL — ABNORMAL HIGH (ref 70–99)
Potassium: 4.2 mmol/L (ref 3.5–5.2)
Sodium: 138 mmol/L (ref 134–144)
Total Protein: 7.5 g/dL (ref 6.0–8.5)
eGFR: 105 mL/min/{1.73_m2} (ref >59)

## 2023-06-06 LAB — HEMOGLOBIN A1C
Est. average glucose Bld gHb Est-mCnc: 111 mg/dL
Hgb A1c MFr Bld: 5.5 % (ref 4.8–5.6)

## 2023-06-06 LAB — CBC WITH DIFFERENTIAL/PLATELET
Basophils Absolute: 0 10*3/uL (ref 0.0–0.2)
Basos: 1 %
EOS (ABSOLUTE): 0.4 10*3/uL (ref 0.0–0.4)
Eos: 9 %
Hematocrit: 49.4 % (ref 37.5–51.0)
Hemoglobin: 16.9 g/dL (ref 13.0–17.7)
Immature Grans (Abs): 0 10*3/uL (ref 0.0–0.1)
Immature Granulocytes: 0 %
Lymphocytes Absolute: 1.8 10*3/uL (ref 0.7–3.1)
Lymphs: 39 %
MCH: 30.9 pg (ref 26.6–33.0)
MCHC: 34.2 g/dL (ref 31.5–35.7)
MCV: 90 fL (ref 79–97)
Monocytes Absolute: 0.3 10*3/uL (ref 0.1–0.9)
Monocytes: 6 %
Neutrophils Absolute: 2.1 10*3/uL (ref 1.4–7.0)
Neutrophils: 45 %
Platelets: 236 10*3/uL (ref 150–450)
RBC: 5.47 x10E6/uL (ref 4.14–5.80)
RDW: 13 % (ref 11.6–15.4)
WBC: 4.7 10*3/uL (ref 3.4–10.8)

## 2023-06-06 LAB — LIPID PANEL
Chol/HDL Ratio: 4.7 ratio (ref 0.0–5.0)
Cholesterol, Total: 216 mg/dL — ABNORMAL HIGH (ref 100–199)
HDL: 46 mg/dL (ref >39)
LDL Chol Calc (NIH): 144 mg/dL — ABNORMAL HIGH (ref 0–99)
Triglycerides: 145 mg/dL (ref 0–149)
VLDL Cholesterol Cal: 26 mg/dL (ref 5–40)

## 2023-06-06 MED ORDER — TRIAMCINOLONE ACETONIDE 40 MG/ML IJ SUSP
80.0000 mg | Freq: Once | INTRAMUSCULAR | Status: AC
  Administered 2023-06-06: 80 mg via INTRAMUSCULAR

## 2023-06-06 MED ORDER — DICYCLOMINE HCL 20 MG PO TABS: 20.0000 mg | ORAL_TABLET | Freq: Three times a day (TID) | ORAL | 3 refills | Status: DC

## 2023-06-06 NOTE — Assessment & Plan Note (Signed)
 Suspected IBS, possibly IBS-D, due to frequent bowel movements and abdominal discomfort. Manageable with medication. - Prescribe Bentyl three times daily before meals for two weeks. If symptoms improve, reduce to once daily. - Consider Xifaxan if Bentyl is ineffective. - Order blood tests for thyroid levels and inflammatory markers for Crohn's disease. - Consider gastroenterologist referral if symptoms persist.

## 2023-06-06 NOTE — Assessment & Plan Note (Signed)
 Family history of colon cancer in first cousins. Discussed early screening and risks of processed foods and microplastics. - Consult gastroenterologist for early colonoscopy screening eligibility. - Consider Cologuard if colonoscopy not indicated. - Educate on reducing processed food intake and avoiding microwaving in plastic.

## 2023-06-06 NOTE — Assessment & Plan Note (Signed)
 Kenalog injection given States has tried all OTC and none have helped Will try benadryl at night if symptoms continue after injection.

## 2023-06-06 NOTE — Assessment & Plan Note (Signed)
 Family history of diabetes. Screening with A1c test to assess blood glucose levels. Discussed lifestyle modifications for management. - Order A1c test. - Discuss lifestyle modifications, including diet and exercise.

## 2023-06-06 NOTE — Assessment & Plan Note (Signed)
 Frequent bowl moments Pressure and fullness with deep LLQ and RLQ palpation.

## 2023-06-06 NOTE — Assessment & Plan Note (Signed)
 No current signs or symptoms Will continue to monitor Labs drawn today Will send to GI if positive markers for Crohn's.

## 2023-06-06 NOTE — Progress Notes (Signed)
 Subjective:  Patient ID: Francisco Robbins, male    DOB: [birth year]  Age: 33 y.o. MRN: [id number]  Chief Complaint  Patient presents with   Annual Exam   Discussed the use of AI scribe software for clinical note transcription with the patient, who gave verbal consent to proceed.   Well Adult Physical: Patient here for a comprehensive physical exam.The patient reports  allergy symptoms. Do you take any herbs or supplements that were not prescribed by a doctor? no Are you taking calcium supplements? no Are you taking aspirin daily? no  Encounter for general adult medical examination without abnormal findings  Physical ("At Risk" items are starred): Patient's last physical exam was 1 year ago .  Patient wears a seat belt, has smoke detectors, has carbon monoxide detectors, practices appropriate gun safety, and wears sunscreen with extended sun exposure. Dental Care: biannual cleanings, brushes and flosses daily. Ophthalmology/Optometry: Annual visit.  Hearing loss: none Vision impairments: none Last PSA:     06/06/2023    8:19 AM 09/15/2020   10:10 AM 08/31/2020    3:19 PM 06/13/2019    8:13 AM  Depression screen PHQ 2/9  Decreased Interest 0 0 0 0  Down, Depressed, Hopeless 0 0 0 0  PHQ - 2 Score 0 0 0 0         06/13/2019    8:13 AM 06/06/2023    8:27 AM  Fall Risk  Falls in the past year? 0 0  Was there an injury with Fall? 0 0  Fall Risk Category Calculator 0 0  Fall Risk Category (Retired) Low   (RETIRED) Patient Fall Risk Level Low fall risk   Patient at Risk for Falls Due to  No Fall Risks  Fall risk Follow up Falls evaluation completed Falls evaluation completed            Past Medical History:  Diagnosis Date   Allergy    Asthma    Past Surgical History:  Procedure Laterality Date   APPENDECTOMY     WISDOM TOOTH EXTRACTION      Family History  Problem Relation Age of Onset   Cancer Mother        breast    Arthritis Mother    Miscarriages / [place]  Mother    Diabetes Father    Alcohol abuse Father    Arthritis Father    Drug abuse Father    Hyperlipidemia Father    Hypertension Father    Kidney disease Father        died 83   Diabetes Other        mat and pat aunts/uncles with DM    Hypertension Brother    Kidney disease Maternal Grandfather    Early death Paternal Grandmother    Early death Paternal Grandfather    Diabetes Sibling    Drug abuse Sibling    Hearing loss Sibling    Miscarriages / [place] Sister    Alcohol abuse Sister    Asthma Sister    Depression Sister    Alcohol abuse Brother    Asthma Brother    Drug abuse Brother    Early death Brother        31 y.o hit and run [place] 48   Social History   Socioeconomic History   Marital status: Married    Spouse name: Not on file   Number of children: Not on file   Years of education: Not on file   Highest education  level: Not on file  Occupational History   Not on file  Tobacco Use   Smoking status: Never   Smokeless tobacco: Never  Substance and Sexual Activity   Alcohol use: Yes    Alcohol/week: 3.0 standard drinks of alcohol    Types: 2 Cans of beer, 1 Shots of liquor per week    Comment: ocasionally   Drug use: Not Currently   Sexual activity: Not Currently  Other Topics Concern   Not on file  Social History Narrative   Married    1 daughter    Works with gutters     No guns, wears seat belt, safe in relationship    HS ed    General labor    Social Drivers of Corporate investment banker Strain: Not on file  Food Insecurity: Not on file  Transportation Needs: Not on file  Physical Activity: Not on file  Stress: Not on file  Social Connections: Not on file   Review of Systems  Constitutional:  Negative for appetite change, fatigue and fever.  HENT:  Positive for congestion, rhinorrhea and sinus pain. Negative for ear pain, sinus pressure and sore throat.   Respiratory:  Negative for cough, chest tightness, shortness of  breath and wheezing.   Cardiovascular:  Negative for chest pain and palpitations.  Gastrointestinal:  Negative for abdominal pain, constipation, diarrhea, nausea and vomiting.  Genitourinary:  Negative for dysuria and hematuria.  Musculoskeletal:  Negative for arthralgias, back pain, joint swelling and myalgias.  Skin:  Negative for rash.  Neurological:  Negative for dizziness, weakness and headaches.  Psychiatric/Behavioral:  Negative for dysphoric mood. The patient is not nervous/anxious.      Objective:  BP 110/72 (BP Location: Left Arm, Patient Position: Sitting)   Pulse 72   Temp 97.9 F (36.6 C) (Temporal)   Ht 5\' 8"  (1.727 m)   Wt 182 lb (82.6 kg)   SpO2 97%   BMI 27.67 kg/m      06/06/2023    8:23 AM 06/05/2022   10:52 AM 05/24/2021   10:19 AM  BP/Weight  Systolic BP 110 110 108  Diastolic BP 72 80 80  Wt. (Lbs) 182 175 180  BMI 27.67 kg/m2 26.61 kg/m2 27.37 kg/m2    Physical Exam Vitals reviewed.  Constitutional:      Appearance: Normal appearance.  HENT:     Right Ear: Tympanic membrane normal.     Left Ear: Tympanic membrane normal.     Nose: Nose normal.     Mouth/Throat:     Pharynx: No oropharyngeal exudate or posterior oropharyngeal erythema.  Eyes:     Conjunctiva/sclera: Conjunctivae normal.  Neck:     Vascular: No carotid bruit.  Cardiovascular:     Rate and Rhythm: Normal rate and regular rhythm.     Heart sounds: Normal heart sounds.  Pulmonary:     Effort: Pulmonary effort is normal.     Breath sounds: Normal breath sounds.  Abdominal:     General: Bowel sounds are normal.     Palpations: Abdomen is soft.     Tenderness: There is no abdominal tenderness.  Skin:    Findings: No lesion or rash.  Neurological:     Mental Status: He is alert and oriented to person, place, and time.  Psychiatric:        Mood and Affect: Mood normal.        Behavior: Behavior normal.     Lab Results  Component Value Date  WBC 4.2 06/05/2022   HGB 14.7  06/05/2022   HCT 43.3 06/05/2022   PLT 201 06/05/2022   GLUCOSE 100 (H) 06/05/2022   CHOL 156 06/05/2022   TRIG 81 06/05/2022   HDL 44 06/05/2022   LDLCALC 97 06/05/2022   ALT 22 06/05/2022   AST 21 06/05/2022   NA 139 06/05/2022   K 4.1 06/05/2022   CL 103 06/05/2022   CREATININE 0.86 06/05/2022   BUN 12 06/05/2022   CO2 23 06/05/2022   TSH 2.32 06/13/2019   HGBA1C 5.2 09/15/2020      Assessment & Plan:  There are no diagnoses linked to this encounter.     Body mass index is 27.67 kg/m.   These are the goals we discussed:  Goals      Increase physical activity         This is a list of the screening recommended for you and due dates:  Health Maintenance  Topic Date Due   Pneumococcal Vaccination (1 of 2 - PCV) Never done   HIV Screening  Never done   Hepatitis C Screening  Never done   Flu Shot  06/18/2023*   COVID-19 Vaccine (1 - 2024-25 season) 06/22/2023*   DTaP/Tdap/Td vaccine (2 - Td or Tdap) 06/11/2028   HPV Vaccine  Aged Out  *Topic was postponed. The date shown is not the original due date.     No orders of the defined types were placed in this encounter.    Follow-up: No follow-ups on file.  An After Visit Summary was printed and given to the patient.   I,Lauren M Auman,acting as a Neurosurgeon for [employer], PA.,have documented all relevant documentation on the behalf of Langley Gauss, PA,as directed by  Langley Gauss, PA while in the presence of Langley Gauss, [place].    Langley Gauss, [place] [facility] [phone]

## 2023-06-06 NOTE — Progress Notes (Signed)
 Subjective:  Patient ID: Francisco Robbins, male    DOB: Jan 06, 1991  Age: 33 y.o. MRN: [id number]  Chief Complaint  Patient presents with   Annual Exam    Discussed the use of AI scribe software for clinical note transcription with the patient, who gave verbal consent to proceed.  History of Present Illness   The patient, with a significant family history of diabetes, high cholesterol, stroke, colon cancer, and Crohn's disease, presents with concerns about the possibility of developing diabetes and colon cancer. The patient reports frequent bowel movements, up to eight times a day, which started about two years ago. The patient describes the bowel movements as not being diarrhea or constipation, but rather a constant need to go. The patient also reports some discomfort in the abdomen, described as a feeling of fullness, especially when pressure is applied. The patient is physically active, running about twenty miles a week, and drinks a lot of water, which may contribute to the frequency of bowel movements. The patient also reports urinating frequently, up to fifty times a day, but attributes this to high water intake.           06/06/2023    8:19 AM 09/15/2020   10:10 AM 08/31/2020    3:19 PM 06/13/2019    8:13 AM  Depression screen PHQ 2/9  Decreased Interest 0 0 0 0  Down, Depressed, Hopeless 0 0 0 0  PHQ - 2 Score 0 0 0 0        06/06/2023    8:27 AM  Fall Risk   Falls in the past year? 0  Number falls in past yr: 0  Injury with Fall? 0  Risk for fall due to : No Fall Risks  Follow up Falls evaluation completed    Patient Care Team: Langley Gauss, [place] as PCP - General (Physician Assistant)   Review of Systems  Constitutional:  Negative for chills, fatigue and fever.  HENT:  Negative for congestion, ear pain and sore throat.   Respiratory:  Negative for cough and shortness of breath.   Cardiovascular:  Negative for chest pain and palpitations.  Gastrointestinal:  Negative for  abdominal pain, constipation, diarrhea, nausea and vomiting.  Genitourinary:  Positive for frequency. Negative for difficulty urinating, dysuria, flank pain and hematuria.  Musculoskeletal:  Negative for arthralgias, back pain and myalgias.  Skin:  Negative for rash.  Neurological:  Negative for dizziness and headaches.  Psychiatric/Behavioral:  Negative for dysphoric mood.     Current Outpatient Medications on File Prior to Visit  Medication Sig Dispense Refill   albuterol (VENTOLIN HFA) 108 (90 Base) MCG/ACT inhaler Inhale 2 puffs into the lungs every 6 (six) hours as needed for wheezing or shortness of breath. (Patient not taking: Reported on 06/06/2023) 8 g 0   No current facility-administered medications on file prior to visit.   Past Medical History:  Diagnosis Date   Allergy    Asthma    Past Surgical History:  Procedure Laterality Date   APPENDECTOMY     WISDOM TOOTH EXTRACTION      Family History  Problem Relation Age of Onset   Cancer Mother        breast    Arthritis Mother    Miscarriages / [place] Mother    Diabetes Father    Alcohol abuse Father    Arthritis Father    Drug abuse Father    Hyperlipidemia Father    Hypertension Father    Kidney disease Father  died 5   Diabetes Other        mat and pat aunts/uncles with DM    Hypertension Brother    Kidney disease Maternal Grandfather    Early death Paternal Grandmother    Early death Paternal Grandfather    Diabetes Sibling    Drug abuse Sibling    Hearing loss Sibling    Miscarriages / Stillbirths Sister    Alcohol abuse Sister    Asthma Sister    Depression Sister    Alcohol abuse Brother    Asthma Brother    Drug abuse Brother    Early death Brother        85 y.o [place] and run [place] 62   Social History   Socioeconomic History   Marital status: Married    Spouse name: Not on file   Number of children: Not on file   Years of education: Not on file   Highest education level:  Not on file  Occupational History   Not on file  Tobacco Use   Smoking status: Never   Smokeless tobacco: Never  Substance and Sexual Activity   Alcohol use: Yes    Alcohol/week: 3.0 standard drinks of alcohol    Types: 2 Cans of beer, 1 Shots of liquor per week    Comment: ocasionally   Drug use: Not Currently   Sexual activity: Not Currently  Other Topics Concern   Not on file  Social History Narrative   Married    1 daughter    Works with gutters     No guns, wears seat belt, safe in relationship    HS ed    General labor    Social Drivers of Corporate investment banker Strain: Not on file  Food Insecurity: Not on file  Transportation Needs: Not on file  Physical Activity: Not on file  Stress: Not on file  Social Connections: Not on file    Objective:  BP 110/72 (BP Location: Left Arm, Patient Position: Sitting)   Pulse 72   Temp 97.9 F (36.6 C) (Temporal)   Ht 5\' 8"  (1.727 m)   Wt 182 lb (82.6 kg)   SpO2 97%   BMI 27.67 kg/m      06/06/2023    8:23 AM 06/05/2022   10:52 AM 05/24/2021   10:19 AM  BP/Weight  Systolic BP 110 110 108  Diastolic BP 72 80 80  Wt. (Lbs) 182 175 180  BMI 27.67 kg/m2 26.61 kg/m2 27.37 kg/m2    Physical Exam Documented in Physical note  Diabetic Foot Exam - Simple   No data filed      Lab Results  Component Value Date   WBC 4.2 06/05/2022   HGB 14.7 06/05/2022   HCT 43.3 06/05/2022   PLT 201 06/05/2022   GLUCOSE 100 (H) 06/05/2022   CHOL 156 06/05/2022   TRIG 81 06/05/2022   HDL 44 06/05/2022   LDLCALC 97 06/05/2022   ALT 22 06/05/2022   AST 21 06/05/2022   NA 139 06/05/2022   K 4.1 06/05/2022   CL 103 06/05/2022   CREATININE 0.86 06/05/2022   BUN 12 06/05/2022   CO2 23 06/05/2022   TSH 2.32 06/13/2019   HGBA1C 5.2 09/15/2020      Assessment & Plan:  General Health Maintenance Generally healthy with regular exercise and diet. Encouraged to continue to mitigate genetic risks for diabetes and  hyperlipidemia. - Encourage continued exercise and healthy diet. - Schedule annual physical  exams.      Encounter for general adult medical examination without abnormal findings Assessment & Plan: Frequent bowl moments Pressure and fullness with deep LLQ and RLQ palpation.   Orders: -     CBC with Differential/Platelet -     Comprehensive metabolic panel -     Lipid panel  Screening for diabetes mellitus Assessment & Plan: Family history of diabetes. Screening with A1c test to assess blood glucose levels. Discussed lifestyle modifications for management. - Order A1c test. - Discuss lifestyle modifications, including diet and exercise.  Orders: -     Hemoglobin A1c  Irritable bowel syndrome with diarrhea Assessment & Plan: Suspected IBS, possibly IBS-D, due to frequent bowel movements and abdominal discomfort. Manageable with medication. - Prescribe Bentyl three times daily before meals for two weeks. If symptoms improve, reduce to once daily. - Consider Xifaxan if Bentyl is ineffective. - Order blood tests for thyroid levels and inflammatory markers for Crohn's disease. - Consider gastroenterologist referral if symptoms persist.  Orders: -     Dicyclomine HCl; Take 1 tablet (20 mg total) by mouth 3 (three) times daily before meals.  Dispense: 90 tablet; Refill: 3 -     TSH  Family history of Crohn's disease Assessment & Plan: No current signs or symptoms Will continue to monitor Labs drawn today Will send to GI if positive markers for Crohn's.   Orders: -     C-reactive protein -     Sedimentation rate  Environmental and seasonal allergies Assessment & Plan: Kenalog injection given States has tried all OTC and none have helped Will try benadryl at night if symptoms continue after injection.   Orders: -     Triamcinolone Acetonide  Screening for colorectal cancer Assessment & Plan: Family history of colon cancer in first cousins. Discussed early screening and  risks of processed foods and microplastics. - Consult gastroenterologist for early colonoscopy screening eligibility. - Consider Cologuard if colonoscopy not indicated. - Educate on reducing processed food intake and avoiding microwaving in plastic.  Orders: -     Ambulatory referral to Gastroenterology     Meds ordered this encounter  Medications   dicyclomine (BENTYL) 20 MG tablet    Sig: Take 1 tablet (20 mg total) by mouth 3 (three) times daily before meals.    Dispense:  90 tablet    Refill:  3   triamcinolone acetonide (KENALOG-40) injection 80 mg    Orders Placed This Encounter  Procedures   CBC with Differential/Platelet   Comprehensive metabolic panel   Lipid panel   Hemoglobin A1c   TSH   C-reactive protein   Sedimentation Rate   Ambulatory referral to Gastroenterology     Follow-up: Return in about 1 year (around 06/05/2024) for cpe, Huston Foley.   I,Zaida Reiland,acting as a Neurosurgeon for [employer], PA.,have documented all relevant documentation on the behalf of Langley Gauss, PA,as directed by  Langley Gauss, PA while in the presence of Langley Gauss, [place].   An After Visit Summary was printed and given to the patient.  Langley Gauss, [place] [facility] [phone]

## 2023-06-06 NOTE — Patient Instructions (Signed)
 VISIT SUMMARY:  During today's visit, we discussed your concerns about the possibility of developing diabetes and colon cancer, given your significant family history. We also addressed your frequent bowel movements and abdominal discomfort, which have been ongoing for about two years. Additionally, we reviewed your general health and lifestyle habits.  YOUR PLAN:  -IRRITABLE BOWEL SYNDROME (IBS): IBS is a condition that affects the digestive system, causing symptoms like frequent bowel movements and abdominal discomfort. We suspect you may have IBS-D, which is characterized by diarrhea-like symptoms. To manage this, we have prescribed Bentyl to be taken three times daily before meals for two weeks. If your symptoms improve, you can reduce the dosage to once daily. If Bentyl is not effective, we may consider Xifaxan. We will also conduct blood tests to check your thyroid levels and look for inflammatory markers for Crohn's disease. If your symptoms persist, we may refer you to a gastroenterologist.  -DIABETES MELLITUS SCREENING: Given your family history of diabetes, we will screen you for diabetes using an A1c test, which measures your average blood glucose levels over the past three months. We also discussed lifestyle modifications, including maintaining a healthy diet and regular exercise, to help manage your risk.  -COLON CANCER SCREENING: Due to your family history of colon cancer, we discussed the importance of early screening. We will consult with a gastroenterologist to determine if you are eligible for an early colonoscopy. If a colonoscopy is not indicated, we may consider using Cologuard, a non-invasive test. Additionally, we talked about reducing your intake of processed foods and avoiding microwaving food in plastic to lower your risk.  -GENERAL HEALTH MAINTENANCE: You are generally healthy and maintain regular exercise and a balanced diet. Continuing these habits is important to mitigate  your genetic risks for diabetes and high cholesterol. We encourage you to keep up with your exercise routine and healthy eating habits. Please schedule annual physical exams to monitor your overall health.  INSTRUCTIONS:  Please follow up with the recommended blood tests for thyroid levels and inflammatory markers for Crohn's disease. Schedule an A1c test for diabetes screening. Consult with a gastroenterologist for early colonoscopy screening eligibility. If you have any questions or if your symptoms persist, please contact our office.

## 2023-06-07 LAB — C-REACTIVE PROTEIN: CRP: <1 mg/L (ref 0–10)

## 2023-06-07 LAB — TSH: TSH: 1.79 u[IU]/mL (ref 0.450–4.500)

## 2023-06-07 LAB — SEDIMENTATION RATE: Sed Rate: 2 mm/h (ref 0–15)

## 2023-06-09 ENCOUNTER — Encounter: Payer: Self-pay | Admitting: Physician Assistant

## 2023-07-30 ENCOUNTER — Encounter (HOSPITAL_COMMUNITY): Payer: Self-pay

## 2023-08-03 NOTE — Progress Notes (Signed)
 Subjective:  Patient ID: Francisco Robbins, male    DOB: 1991/02/19  Age: 33 y.o. MRN: [id number]  Chief Complaint  Patient presents with   Chronic follow up    HPI:  Discussed the use of AI scribe software for clinical note transcription with the patient, who gave verbal consent to proceed.  History of Present Illness   Francisco Robbins is a 33 year old male who presents with chronic sleep disturbances and shoulder pain.  He experiences chronic sleep disturbances characterized by difficulty staying asleep, waking up after one or two hours, and being unable to return to sleep. He averages about six hours of sleep per night but does not feel rested. Over-the-counter medications like Benadryl and motion sickness pills provide about five hours of sleep but are not effective long-term. No daytime tiredness, but he wishes to feel more rested. Sleep hygiene measures like avoiding electronics before bed have not significantly improved his sleep.  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 He acknowledges that stress may contribute to his sleep issues, as he often wakes up thinking about tasks for the next day. He has not previously used prescription sleep medications.  He reports snoring and has started using mouth tape to address it. He suspects he may have sleep apnea, as his daughter, who also had sleep apnea, stopped snoring after a tonsillectomy. He has not had his tonsils removed.  He experiences shoulder pain that affects his ability to find a comfortable sleeping position. Lying on his back is slightly better, but he often tosses and turns due to discomfort when lying on his side.  He has a history of gastrointestinal issues, including constipation and diarrhea, with regular bowel movements but fluctuations. Vanatol was ineffective. He has a history of elevated liver enzymes.  He is physically active, running about twenty miles a  week and participating in marathons. He recently completed a ten-mile run and a five-kilometer race, placing well in both. He experiences a dry, wheezing cough after running, for which he uses albuterol  inhalers.  He has a family history of sleep apnea, as his daughter was diagnosed and treated successfully with a tonsillectomy. His daughter also has severe allergies, similar to his own, which are managed with Kenalog  injections.         08/06/2023    8:53 AM 06/06/2023    8:19 AM 09/15/2020   10:10 AM 08/31/2020    3:19 PM 06/13/2019    8:13 AM  Depression screen PHQ 2/9  Decreased Interest 0 0 0 0 0  Down, Depressed, Hopeless 1 0 0 0 0  PHQ - 2 Score 1 0 0 0 0  Altered sleeping 2      Tired, decreased energy 1      Change in appetite 1      Feeling bad or failure about yourself  1      Trouble concentrating 3      Moving slowly or fidgety/restless 2      Suicidal thoughts 0      PHQ-9 Score 11      Difficult doing work/chores Somewhat difficult            08/06/2023    8:53 AM  Fall Risk   Falls in the past year? 0  Number falls in past yr: 0  Injury with Fall? 0  Risk for fall due to : No Fall Risks  Follow up Falls evaluation completed  Patient Care Team: Odilia Bennett, [place] as PCP - General (Physician Assistant)   Review of Systems  Constitutional:  Negative for appetite change, fatigue and fever.  HENT:  Negative for congestion, ear pain, sinus pressure and sore throat.   Respiratory:  Negative for cough, chest tightness, shortness of breath and wheezing.   Cardiovascular:  Negative for chest pain and palpitations.  Gastrointestinal:  Positive for abdominal pain (IBS-constant feeling the need to use the bathroom). Negative for constipation, diarrhea, nausea and vomiting.  Genitourinary:  Negative for dysuria and hematuria.  Musculoskeletal:  Negative for arthralgias, back pain, joint swelling and myalgias.  Skin:  Negative for rash.  Neurological:  Negative for  dizziness, weakness and headaches.  Psychiatric/Behavioral:  Positive for sleep disturbance. Negative for dysphoric mood. The patient is not nervous/anxious.     Current Outpatient Medications on File Prior to Visit  Medication Sig Dispense Refill   dicyclomine  (BENTYL ) 20 MG tablet Take 1 tablet (20 mg total) by mouth 3 (three) times daily before meals. 90 tablet 3   No current facility-administered medications on file prior to visit.   Past Medical History:  Diagnosis Date   Allergy    Asthma    Past Surgical History:  Procedure Laterality Date   APPENDECTOMY     WISDOM TOOTH EXTRACTION      Family History  Problem Relation Age of Onset   Cancer Mother        breast    Arthritis Mother    Miscarriages / [place] Mother    Diabetes Father    Alcohol abuse Father    Arthritis Father    Drug abuse Father    Hyperlipidemia Father    Hypertension Father    Kidney disease Father        died 23   Diabetes Other        mat and pat aunts/uncles with DM    Hypertension Brother    Kidney disease Maternal Grandfather    Early death Paternal Grandmother    Early death Paternal Grandfather    Diabetes Sibling    Drug abuse Sibling    Hearing loss Sibling    Miscarriages / Stillbirths Sister    Alcohol abuse Sister    Asthma Sister    Depression Sister    Alcohol abuse Brother    Asthma Brother    Drug abuse Brother    Early death Brother        105 y.o hit and run [place]   Social History   Socioeconomic History   Marital status: Married    Spouse name: Not on file   Number of children: Not on file   Years of education: Not on file   Highest education level: Not on file  Occupational History   Not on file  Tobacco Use   Smoking status: Never   Smokeless tobacco: Never  Substance and Sexual Activity   Alcohol use: Yes    Alcohol/week: 3.0 standard drinks of alcohol    Types: 2 Cans of beer, 1 Shots of liquor per week    Comment: ocasionally   Drug  use: Not Currently   Sexual activity: Not Currently  Other Topics Concern   Not on file  Social History Narrative   Married    1 daughter    Works with gutters     No guns, wears seat belt, safe in relationship    HS ed    General labor    Social  Drivers of Corporate investment banker Strain: Not on file  Food Insecurity: Not on file  Transportation Needs: Not on file  Physical Activity: Not on file  Stress: Not on file  Social Connections: Not on file    Objective:  BP 122/88 (BP Location: Left Arm, Patient Position: Sitting)   Pulse 76   Temp (!) 97.4 F (36.3 C) (Temporal)   Ht 5\' 8"  (1.727 m)   Wt 177 lb 12.8 oz (80.6 kg)   SpO2 99%   BMI 27.03 kg/m      08/06/2023    8:43 AM 06/06/2023    8:23 AM 06/05/2022   10:52 AM  BP/Weight  Systolic BP 122 110 110  Diastolic BP 88 72 80  Wt. (Lbs) 177.8 182 175  BMI 27.03 kg/m2 27.67 kg/m2 26.61 kg/m2    Physical Exam Vitals reviewed.  Constitutional:      Appearance: Normal appearance.  Cardiovascular:     Rate and Rhythm: Normal rate and regular rhythm.     Heart sounds: Normal heart sounds.  Pulmonary:     Effort: Pulmonary effort is normal.     Breath sounds: Normal breath sounds.  Abdominal:     General: Bowel sounds are normal.     Palpations: Abdomen is soft.     Tenderness: There is no abdominal tenderness.     Comments: Fullness noted more than tenderness when applying pressure  Neurological:     Mental Status: He is alert and oriented to person, place, and time.  Psychiatric:        Mood and Affect: Mood normal.        Behavior: Behavior normal.     Diabetic Foot Exam - Simple   No data filed      Lab Results  Component Value Date   WBC 4.7 06/06/2023   HGB 16.9 06/06/2023   HCT 49.4 06/06/2023   PLT 236 06/06/2023   GLUCOSE 102 (H) 06/06/2023   CHOL 216 (H) 06/06/2023   TRIG 145 06/06/2023   HDL 46 06/06/2023   LDLCALC 144 (H) 06/06/2023   ALT 53 (H) 06/06/2023   AST 37 06/06/2023    NA 138 06/06/2023   K 4.2 06/06/2023   CL 100 06/06/2023   CREATININE 0.98 06/06/2023   BUN 16 06/06/2023   CO2 22 06/06/2023   TSH 1.790 06/06/2023   HGBA1C 5.5 06/06/2023      Assessment & Plan:  Follow-up Plan to monitor response to new medication and ensure completion of referrals. - Schedule follow-up appointment in a little over a month to assess response to Prozac  and review sleep study results. - Ensure GI referral is completed; contact GI office if no appointment is scheduled by Thursday.   Mixed hyperlipidemia Assessment & Plan: Hypercholesterolemia with previous liver enzyme elevation. Discussed energy drinks' impact on liver and cholesterol. - Order blood work to assess cholesterol levels and liver function.  Orders: -     CBC with Differential/Platelet -     Comprehensive metabolic panel with GFR -     Lipid panel  Mild intermittent asthma without complication Assessment & Plan: Wheezing and dry cough after running, suggestive of exercise-induced bronchospasm. - Prescribe albuterol  inhaler for use before exercise to prevent bronchospasm. - Provide two inhalers, one for work and one for home.  Orders: -     Albuterol  Sulfate HFA; Inhale 2 puffs into the lungs every 6 (six) hours as needed for wheezing or shortness of breath.  Dispense: 16  g; Refill: 3  Irritable bowel syndrome with diarrhea Assessment & Plan: Chronic IBS with alternating diarrhea and constipation, potentially linked to anxiety. - Order abdominal ultrasound to evaluate liver, intestines, gallbladder, and kidneys. - Prescribe low-dose Prozac  to address potential anxiety-related IBS symptoms.  Orders: -     FLUoxetine  HCl; Take 1 tablet (20 mg total) by mouth daily.  Dispense: 30 tablet; Refill: 2 -     US  Abdomen Complete; Future  Environmental and seasonal allergies Assessment & Plan: Wheezing and dry cough after running, suggestive of exercise-induced bronchospasm. - Prescribe  albuterol  inhaler for use before exercise to prevent bronchospasm. - Provide two inhalers, one for work and one for home.   Sleep disorder Assessment & Plan: Chronic sleep disorder with suspected obstructive sleep apnea and possible stress-related insomnia. - Order in-house sleep study to evaluate for obstructive sleep apnea and insomnia. - Evaluate tonsils for potential enlargement and refer to ENT if indicated.     Plan to monitor response to new medication and ensure completion of referrals. - Schedule follow-up appointment in a little over a month to assess response to Prozac  and review sleep study results. - Ensure GI referral is completed; contact GI office if no appointment is scheduled by Thursday.            Meds ordered this encounter  Medications   FLUoxetine  (PROZAC ) 20 MG tablet    Sig: Take 1 tablet (20 mg total) by mouth daily.    Dispense:  30 tablet    Refill:  2   albuterol  (VENTOLIN  HFA) 108 (90 Base) MCG/ACT inhaler    Sig: Inhale 2 puffs into the lungs every 6 (six) hours as needed for wheezing or shortness of breath.    Dispense:  16 g    Refill:  3    Orders Placed This Encounter  Procedures   US  Abdomen Complete   CBC with Differential/Platelet   Comprehensive metabolic panel with GFR   Lipid panel     Follow-up: Return in about 4 weeks (around 09/03/2023).   I,Lauren M Auman,acting as a Neurosurgeon for [employer], PA.,have documented all relevant documentation on the behalf of Odilia Bennett, PA,as directed by  Odilia Bennett, PA while in the presence of Odilia Bennett, [place].   An After Visit Summary was printed and given to the patient.  Odilia Bennett, [place] [facility] [phone]

## 2023-08-06 ENCOUNTER — Ambulatory Visit: Admitting: Physician Assistant

## 2023-08-06 ENCOUNTER — Encounter: Payer: Self-pay | Admitting: Physician Assistant

## 2023-08-06 ENCOUNTER — Other Ambulatory Visit: Payer: Self-pay

## 2023-08-06 VITALS — BP 122/88 | HR 76 | Temp 97.4°F | Ht 68.0 in | Wt 177.8 lb

## 2023-08-06 DIAGNOSIS — J3089 Other allergic rhinitis: Secondary | ICD-10-CM | POA: Diagnosis not present

## 2023-08-06 DIAGNOSIS — K580 Irritable bowel syndrome with diarrhea: Secondary | ICD-10-CM | POA: Diagnosis not present

## 2023-08-06 DIAGNOSIS — J4520 Mild intermittent asthma, uncomplicated: Secondary | ICD-10-CM

## 2023-08-06 DIAGNOSIS — E782 Mixed hyperlipidemia: Secondary | ICD-10-CM | POA: Diagnosis not present

## 2023-08-06 DIAGNOSIS — G479 Sleep disorder, unspecified: Secondary | ICD-10-CM | POA: Insufficient documentation

## 2023-08-06 MED ORDER — FLUOXETINE HCL 20 MG PO TABS
20.0000 mg | ORAL_TABLET | Freq: Every day | ORAL | 2 refills | Status: AC
Start: 2023-08-06 — End: ?

## 2023-08-06 MED ORDER — ALBUTEROL SULFATE HFA 108 (90 BASE) MCG/ACT IN AERS: 2.0000 | INHALATION_SPRAY | Freq: Four times a day (QID) | RESPIRATORY_TRACT | 3 refills | Status: AC | PRN

## 2023-08-06 MED ORDER — ALBUTEROL SULFATE HFA 108 (90 BASE) MCG/ACT IN AERS: 2.0000 | INHALATION_SPRAY | Freq: Four times a day (QID) | RESPIRATORY_TRACT | 3 refills | Status: DC | PRN

## 2023-08-06 NOTE — Assessment & Plan Note (Signed)
 Wheezing and dry cough after running, suggestive of exercise-induced bronchospasm. - Prescribe albuterol  inhaler for use before exercise to prevent bronchospasm. - Provide two inhalers, one for work and one for home.

## 2023-08-06 NOTE — Assessment & Plan Note (Signed)
 Chronic IBS with alternating diarrhea and constipation, potentially linked to anxiety. - Order abdominal ultrasound to evaluate liver, intestines, gallbladder, and kidneys. - Prescribe low-dose Prozac  to address potential anxiety-related IBS symptoms.

## 2023-08-06 NOTE — Assessment & Plan Note (Signed)
 Chronic sleep disorder with suspected obstructive sleep apnea and possible stress-related insomnia. - Order in-house sleep study to evaluate for obstructive sleep apnea and insomnia. - Evaluate tonsils for potential enlargement and refer to ENT if indicated.

## 2023-08-06 NOTE — Assessment & Plan Note (Signed)
 Hypercholesterolemia with previous liver enzyme elevation. Discussed energy drinks' impact on liver and cholesterol. - Order blood work to assess cholesterol levels and liver function.

## 2023-08-06 NOTE — Patient Instructions (Signed)
 VISIT SUMMARY:  During your visit, we discussed your chronic sleep disturbances, shoulder pain, anxiety, irritable bowel syndrome (IBS), hypercholesterolemia, elevated liver enzymes, and exercise-induced bronchospasm. We have developed a comprehensive plan to address each of these issues.  YOUR PLAN:  -SLEEP DISORDER: You have a chronic sleep disorder with suspected obstructive sleep apnea and possible stress-related insomnia. We will conduct an in-house sleep study to evaluate for these conditions. Additionally, we will check your tonsils for potential enlargement and refer you to an ENT specialist if needed.  -SHOULDER PAIN: Your shoulder pain is affecting your sleep quality. We will evaluate this further if it continues to impact your sleep.  -ANXIETY: Anxiety can contribute to sleep disorders and IBS. We have prescribed a low dose of Prozac  to help manage your anxiety and its impact on your sleep and bowel movements.  -IRRITABLE BOWEL SYNDROME (IBS): IBS involves chronic gastrointestinal issues like alternating diarrhea and constipation, which may be linked to anxiety. We will perform an abdominal ultrasound to evaluate your liver, intestines, gallbladder, and kidneys. Additionally, the low dose of Prozac  should help manage anxiety-related IBS symptoms.  -HYPERCHOLESTEROLEMIA: Hypercholesterolemia means you have high cholesterol levels, which can affect your liver. We will order blood work to assess your cholesterol levels and liver function.  -ELEVATED LIVER ENZYMES: You have had elevated liver enzymes in the past, which could be related to high cholesterol. We will order blood work to check your liver function.  -EXERCISE-INDUCED BRONCHOSPASM: Exercise-induced bronchospasm causes wheezing and a dry cough after running. We have prescribed an albuterol  inhaler for you to use before exercise to prevent these symptoms. You will receive two inhalers, one for work and one for  home.  INSTRUCTIONS:  Please schedule a follow-up appointment in a little over a month to assess your response to Prozac  and review the results of your sleep study. Ensure that your GI referral is completed; contact the GI office if no appointment is scheduled by Thursday.

## 2023-08-07 ENCOUNTER — Encounter: Payer: Self-pay | Admitting: Physician Assistant

## 2023-08-07 LAB — CBC WITH DIFFERENTIAL/PLATELET
Basophils Absolute: 0 10*3/uL (ref 0.0–0.2)
Basos: 1 %
EOS (ABSOLUTE): 0.2 10*3/uL (ref 0.0–0.4)
Eos: 5 %
Hematocrit: 44.4 % (ref 37.5–51.0)
Hemoglobin: 15.2 g/dL (ref 13.0–17.7)
Immature Grans (Abs): 0 10*3/uL (ref 0.0–0.1)
Immature Granulocytes: 0 %
Lymphocytes Absolute: 1.3 10*3/uL (ref 0.7–3.1)
Lymphs: 33 %
MCH: 31.6 pg (ref 26.6–33.0)
MCHC: 34.2 g/dL (ref 31.5–35.7)
MCV: 92 fL (ref 79–97)
Monocytes Absolute: 0.2 10*3/uL (ref 0.1–0.9)
Monocytes: 6 %
Neutrophils Absolute: 2.3 10*3/uL (ref 1.4–7.0)
Neutrophils: 55 %
Platelets: 192 10*3/uL (ref 150–450)
RBC: 4.81 x10E6/uL (ref 4.14–5.80)
RDW: 13.1 % (ref 11.6–15.4)
WBC: 4.1 10*3/uL (ref 3.4–10.8)

## 2023-08-07 LAB — LIPID PANEL
Chol/HDL Ratio: 3.7 ratio (ref 0.0–5.0)
Cholesterol, Total: 208 mg/dL — ABNORMAL HIGH (ref 100–199)
HDL: 56 mg/dL (ref >39)
LDL Chol Calc (NIH): 139 mg/dL — ABNORMAL HIGH (ref 0–99)
Triglycerides: 72 mg/dL (ref 0–149)
VLDL Cholesterol Cal: 13 mg/dL (ref 5–40)

## 2023-08-07 LAB — COMPREHENSIVE METABOLIC PANEL WITH GFR
ALT: 20 IU/L (ref 0–44)
AST: 21 IU/L (ref 0–40)
Albumin: 4.8 g/dL (ref 4.1–5.1)
Alkaline Phosphatase: 95 IU/L (ref 44–121)
BUN/Creatinine Ratio: 15 (ref 9–20)
BUN: 13 mg/dL (ref 6–20)
Bilirubin Total: 0.6 mg/dL (ref 0.0–1.2)
CO2: 20 mmol/L (ref 20–29)
Calcium: 9.2 mg/dL (ref 8.7–10.2)
Chloride: 102 mmol/L (ref 96–106)
Creatinine, Ser: 0.87 mg/dL (ref 0.76–1.27)
Globulin, Total: 2.1 g/dL (ref 1.5–4.5)
Glucose: 106 mg/dL — ABNORMAL HIGH (ref 70–99)
Potassium: 4.4 mmol/L (ref 3.5–5.2)
Sodium: 138 mmol/L (ref 134–144)
Total Protein: 6.9 g/dL (ref 6.0–8.5)
eGFR: 117 mL/min/{1.73_m2} (ref >59)

## 2023-08-07 NOTE — Telephone Encounter (Signed)
 Referral and US  order is faxed to [facility]. Pt will need to cancel appointment for ultrasound at [facility]. Also can you update his ultrasound preferred location to external?

## 2023-08-14 ENCOUNTER — Ambulatory Visit: Payer: Self-pay | Admitting: Physician Assistant

## 2023-08-24 ENCOUNTER — Other Ambulatory Visit (HOSPITAL_BASED_OUTPATIENT_CLINIC_OR_DEPARTMENT_OTHER): Admitting: Radiology

## 2023-09-02 NOTE — Progress Notes (Unsigned)
 Subjective:  Patient ID: Francisco Robbins, male    DOB: 02-03-1991  Age: 33 y.o. MRN: [id number]  No chief complaint on file.   HPI:     08/06/2023    8:53 AM 06/06/2023    8:19 AM 09/15/2020   10:10 AM 08/31/2020    3:19 PM 06/13/2019    8:13 AM  Depression screen PHQ 2/9  Decreased Interest 0 0 0 0 0  Down, Depressed, Hopeless 1 0 0 0 0  PHQ - 2 Score 1 0 0 0 0  Altered sleeping 2      Tired, decreased energy 1      Change in appetite 1      Feeling bad or failure about yourself  1      Trouble concentrating 3      Moving slowly or fidgety/restless 2      Suicidal thoughts 0      PHQ-9 Score 11      Difficult doing work/chores Somewhat difficult            08/06/2023    8:53 AM  Fall Risk   Falls in the past year? 0  Number falls in past yr: 0  Injury with Fall? 0  Risk for fall due to : No Fall Risks  Follow up Falls evaluation completed    Patient Care Team: Odilia Bennett, [place] as PCP - General (Physician Assistant)   Review of Systems  Current Outpatient Medications on File Prior to Visit  Medication Sig Dispense Refill   albuterol  (VENTOLIN  HFA) 108 (90 Base) MCG/ACT inhaler Inhale 2 puffs into the lungs every 6 (six) hours as needed for wheezing or shortness of breath. 16 g 3   dicyclomine  (BENTYL ) 20 MG tablet Take 1 tablet (20 mg total) by mouth 3 (three) times daily before meals. 90 tablet 3   FLUoxetine  (PROZAC ) 20 MG tablet Take 1 tablet (20 mg total) by mouth daily. 30 tablet 2   No current facility-administered medications on file prior to visit.   Past Medical History:  Diagnosis Date   Allergy    Asthma    Past Surgical History:  Procedure Laterality Date   APPENDECTOMY     WISDOM TOOTH EXTRACTION      Family History  Problem Relation Age of Onset   Cancer Mother        breast    Arthritis Mother    Miscarriages / [place] Mother    Diabetes Father    Alcohol abuse Father    Arthritis Father    Drug abuse Father    Hyperlipidemia Father     Hypertension Father    Kidney disease Father        died 8   Diabetes Other        mat and pat aunts/uncles with DM    Hypertension Brother    Kidney disease Maternal Grandfather    Early death Paternal Grandmother    Early death Paternal Grandfather    Diabetes Sibling    Drug abuse Sibling    Hearing loss Sibling    Miscarriages / Stillbirths Sister    Alcohol abuse Sister    Asthma Sister    Depression Sister    Alcohol abuse Brother    Asthma Brother    Drug abuse Brother    Early death Brother        33 y.o hit and run [place]   Social History   Socioeconomic History   Marital status:  Married    Spouse name: Not on file   Number of children: Not on file   Years of education: Not on file   Highest education level: Not on file  Occupational History   Not on file  Tobacco Use   Smoking status: Never   Smokeless tobacco: Never  Substance and Sexual Activity   Alcohol use: Yes    Alcohol/week: 3.0 standard drinks of alcohol    Types: 2 Cans of beer, 1 Shots of liquor per week    Comment: ocasionally   Drug use: Not Currently   Sexual activity: Not Currently  Other Topics Concern   Not on file  Social History Narrative   Married    1 daughter    Works with gutters     No guns, wears seat belt, safe in relationship    HS ed    General labor    Social Drivers of Corporate investment banker Strain: Not on file  Food Insecurity: Not on file  Transportation Needs: Not on file  Physical Activity: Not on file  Stress: Not on file  Social Connections: Not on file    Objective:  There were no vitals taken for this visit.     08/06/2023    8:43 AM 06/06/2023    8:23 AM 06/05/2022   10:52 AM  BP/Weight  Systolic BP 122 110 110  Diastolic BP 88 72 80  Wt. (Lbs) 177.8 182 175  BMI 27.03 kg/m2 27.67 kg/m2 26.61 kg/m2    Physical Exam  {Perform Simple Foot Exam  Perform Detailed exam:1} {Insert foot Exam (Optional):30965}   Lab Results   Component Value Date   WBC 4.1 08/06/2023   HGB 15.2 08/06/2023   HCT 44.4 08/06/2023   PLT 192 08/06/2023   GLUCOSE 106 (H) 08/06/2023   CHOL 208 (H) 08/06/2023   TRIG 72 08/06/2023   HDL 56 08/06/2023   LDLCALC 139 (H) 08/06/2023   ALT 20 08/06/2023   AST 21 08/06/2023   NA 138 08/06/2023   K 4.4 08/06/2023   CL 102 08/06/2023   CREATININE 0.87 08/06/2023   BUN 13 08/06/2023   CO2 20 08/06/2023   TSH 1.790 06/06/2023   HGBA1C 5.5 06/06/2023      Assessment & Plan:  There are no diagnoses linked to this encounter.   No orders of the defined types were placed in this encounter.   No orders of the defined types were placed in this encounter.    Follow-up: No follow-ups on file.   I,Marla I Leal-Borjas,acting as a scribe for [employer], PA.,have documented all relevant documentation on the behalf of Odilia Bennett, PA,as directed by  Odilia Bennett, PA while in the presence of Odilia Bennett, [place].   An After Visit Summary was printed and given to the patient.  Odilia Bennett, [place] [facility] [phone]

## 2023-09-03 ENCOUNTER — Encounter: Payer: Self-pay | Admitting: Physician Assistant

## 2023-09-03 ENCOUNTER — Ambulatory Visit: Admitting: Physician Assistant

## 2023-09-03 VITALS — BP 110/80 | HR 55 | Temp 97.4°F | Resp 16 | Ht 68.0 in | Wt 179.0 lb

## 2023-09-03 DIAGNOSIS — R1084 Generalized abdominal pain: Secondary | ICD-10-CM | POA: Diagnosis not present

## 2023-09-03 DIAGNOSIS — G479 Sleep disorder, unspecified: Secondary | ICD-10-CM

## 2023-09-03 DIAGNOSIS — E782 Mixed hyperlipidemia: Secondary | ICD-10-CM

## 2023-09-03 DIAGNOSIS — K4090 Unilateral inguinal hernia, without obstruction or gangrene, not specified as recurrent: Secondary | ICD-10-CM | POA: Diagnosis not present

## 2023-09-03 DIAGNOSIS — K580 Irritable bowel syndrome with diarrhea: Secondary | ICD-10-CM | POA: Diagnosis not present

## 2023-09-03 MED ORDER — RIFAXIMIN 550 MG PO TABS: 550.0000 mg | ORAL_TABLET | Freq: Two times a day (BID) | ORAL | 0 refills | Status: DC

## 2023-09-03 NOTE — Assessment & Plan Note (Signed)
 Infrequent sharp pain, biennial occurrence. No current symptoms. Monitoring advised for changes. - Monitor for changes in hernia symptoms. - Seek evaluation if symptoms worsen or become more frequent.

## 2023-09-03 NOTE — Assessment & Plan Note (Signed)
 Elevated cholesterol improving with diet and exercise. Discussed Mediterranean diet benefits. - Continue current diet and exercise regimen. - Recheck cholesterol levels in August or September.

## 2023-09-03 NOTE — Assessment & Plan Note (Signed)
 Suspected due to snoring and apnea. No daytime sleepiness. At-home sleep study to evaluate for obstructive causes. - Order at-home sleep study through [employer].

## 2023-09-03 NOTE — Patient Instructions (Signed)
 VISIT SUMMARY:  During today's visit, we discussed your frequent bowel movements and abdominal pain related to IBS, your concerns about snoring and possible sleep apnea, and your history of hernia and elevated cholesterol. We reviewed your current health status and made plans for further evaluation and treatment.  YOUR PLAN:  -IRRITABLE BOWEL SYNDROME (IBS) WITH DIARRHEA: IBS is a condition that affects your digestive system, causing symptoms like frequent bowel movements and abdominal pain. We will try a medication called Xifaxan for 14 days to see if it helps. If it doesn't, we may need to do a colonoscopy after an abdominal ultrasound. You will also be referred to a GI specialist, Dr. Monico Anna, for further evaluation.  -ABDOMINAL PAIN: Your intermittent sharp abdominal pain might be related to your gallbladder. We will start with an abdominal ultrasound to investigate further. If the ultrasound doesn't provide clear answers, we may need to do a HIDA scan. The results will be discussed with Dr. Monico Anna, the GI specialist.  -SLEEP APNEA: Sleep apnea is a condition where you stop breathing for short periods during sleep, often due to airway blockage. We will arrange for an at-home sleep study to check for this condition.  -HERNIA: A hernia occurs when an organ pushes through an opening in the muscle or tissue that holds it in place. You have a history of hernia that occasionally causes pain. Continue to monitor it, and seek evaluation if the symptoms worsen or become more frequent.  -HYPERLIPIDEMIA: Hyperlipidemia means you have high levels of cholesterol in your blood. Your levels are improving with your current diet and exercise. We discussed the benefits of the Mediterranean diet. Continue with your current regimen and recheck your cholesterol levels in August or September.  -GENERAL HEALTH MAINTENANCE: Your BMI is 27.2, and you are physically active. Your A1c is normal, but given your  family history of diabetes, continue to focus on diet and exercise. Consider following a Mediterranean diet. We will reassess your BMI and overall health during your next follow-up visit.  INSTRUCTIONS:  1. Take Xifaxan as prescribed for 14 days. 2. Complete the at-home sleep study through [facility]. 3. Schedule and complete the abdominal ultrasound. 4. Follow up with Dr. Monico Anna, the GI specialist, for further evaluation. 5. Continue monitoring your hernia for any changes. 6. Recheck your cholesterol levels in August or September.

## 2023-09-03 NOTE — Assessment & Plan Note (Signed)
 Intermittent sharp pain, possibly gallbladder-related. Running may reduce frequency. Consider HIDA scan if ultrasound inconclusive. - Ultrasound being done today - Consider HIDA scan if ultrasound is inconclusive. - Discuss results with GI specialist Dr. Monico Anna.

## 2023-09-03 NOTE — Assessment & Plan Note (Signed)
 Chronic IBS with diarrhea, 4-5 bowel movements daily. Bentyl  ineffective. Considering Xifaxan for treatment. Discussed side effects and treatment duration. If ineffective, consider colonoscopy post-ultrasound. - Provide Xifaxan sample for trial. - Prescribe Xifaxan for 14 days. - Order abdominal ultrasound. - Refer to GI specialist Dr. Monico Anna.

## 2023-09-05 ENCOUNTER — Ambulatory Visit: Payer: Self-pay | Admitting: Physician Assistant

## 2023-09-05 ENCOUNTER — Encounter: Payer: Self-pay | Admitting: Physician Assistant

## 2023-09-05 DIAGNOSIS — R1084 Generalized abdominal pain: Secondary | ICD-10-CM

## 2023-09-07 ENCOUNTER — Other Ambulatory Visit: Payer: Self-pay | Admitting: Physician Assistant

## 2023-09-07 DIAGNOSIS — K580 Irritable bowel syndrome with diarrhea: Secondary | ICD-10-CM

## 2023-09-18 MED ORDER — CHOLESTYRAMINE 4 G PO PACK: 4.0000 g | PACK | Freq: Three times a day (TID) | ORAL | 3 refills | Status: DC

## 2023-12-05 NOTE — Progress Notes (Signed)
 Subjective:  Patient ID: Francisco Robbins, male    DOB: 01/13/1991  Age: 33 y.o. MRN: [id number]  Chief Complaint  Patient presents with   Medical Management of Chronic Issues    HPI: Discussed the use of AI scribe software for clinical note transcription with the patient, who gave verbal consent to proceed.  History of Present Illness Francisco Robbins is a 33 year old male who presents with a persistent cough.  He has been experiencing a persistent cough for about two weeks, initially severe enough to disrupt sleep and accompanied by a fever for the first three days. The fever resolved, but the cough persists. He sought care through an urgent care service and was prescribed benzonatate  and Mucinex, which he continues to take. The cough has improved but is still present, and he notes that he typically experiences a cough lasting about a month during this time of year.  He recently underwent a colonoscopy, which was reported as normal. He has been exploring lifestyle changes such as running and fasting, which have led to some improvement in symptoms and weight loss from 190 to 184 pounds. 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 He is also taking fiber packets mixed with water, which he recently started upon his mother's recommendation.  He reports persistent snoring.         12/10/2023    7:41 AM 08/06/2023    8:53 AM 06/06/2023    8:19 AM 09/15/2020   10:10 AM 08/31/2020    3:19 PM  Depression screen PHQ 2/9  Decreased Interest 0 0 0 0 0  Down, Depressed, Hopeless 0 1 0 0 0  PHQ - 2 Score 0 1 0 0 0  Altered sleeping  2     Tired, decreased energy  1     Change in appetite  1     Feeling bad or failure about yourself   1     Trouble concentrating  3     Moving slowly or fidgety/restless  2     Suicidal thoughts  0     PHQ-9 Score  11     Difficult doing work/chores  Somewhat difficult           12/10/2023    7:41 AM  Fall Risk   Falls in the past year?  0  Number falls in past yr: 0  Injury with Fall? 0  Risk for fall due to : No Fall Risks  Follow up Falls evaluation completed    Patient Care Team: Milon Cleaves, [place] as PCP - General (Physician Assistant)   Review of Systems  Constitutional:  Negative for appetite change, fatigue and fever.  HENT:  Negative for congestion, ear pain, sinus pressure and sore throat.   Respiratory:  Negative for cough, chest tightness, shortness of breath and wheezing.   Cardiovascular:  Negative for chest pain and palpitations.  Gastrointestinal:  Negative for abdominal pain, constipation, diarrhea, nausea and vomiting.  Genitourinary:  Negative for dysuria and hematuria.  Musculoskeletal:  Negative for arthralgias, back pain, joint swelling and myalgias.  Skin:  Negative for rash.  Neurological:  Negative for dizziness, weakness and headaches.  Psychiatric/Behavioral:  Negative for dysphoric mood. The patient is not nervous/anxious.     Current Outpatient Medications on File Prior to Visit  Medication Sig Dispense Refill   albuterol  (VENTOLIN  HFA) 108 (90 Base) MCG/ACT inhaler Inhale 2 puffs into the lungs every 6 (six) hours as needed  for wheezing or shortness of breath. 16 g 3   No current facility-administered medications on file prior to visit.   Past Medical History:  Diagnosis Date   Allergy    Asthma    Past Surgical History:  Procedure Laterality Date   APPENDECTOMY     WISDOM TOOTH EXTRACTION      Family History  Problem Relation Age of Onset   Cancer Mother        breast    Arthritis Mother    Miscarriages / [place] Mother    Diabetes Father    Alcohol abuse Father    Arthritis Father    Drug abuse Father    Hyperlipidemia Father    Hypertension Father    Kidney disease Father        died 85   Diabetes Other        mat and pat aunts/uncles with DM    Hypertension Brother    Kidney disease Maternal Grandfather    Early death Paternal Grandmother    Early death  Paternal Grandfather    Diabetes Sibling    Drug abuse Sibling    Hearing loss Sibling    Miscarriages / Stillbirths Sister    Alcohol abuse Sister    Asthma Sister    Depression Sister    Alcohol abuse Brother    Asthma Brother    Drug abuse Brother    Early death Brother        60 y.o [place] and run [place] [address]   Social History   Socioeconomic History   Marital status: Married    Spouse name: Not on file   Number of children: Not on file   Years of education: Not on file   Highest education level: Not on file  Occupational History   Not on file  Tobacco Use   Smoking status: Never   Smokeless tobacco: Never  Vaping Use   Vaping status: Never Used  Substance and Sexual Activity   Alcohol use: Yes    Alcohol/week: 2.0 standard drinks of alcohol    Types: 2 Cans of beer per week    Comment: ocasionally   Drug use: Not Currently   Sexual activity: Yes    Partners: Female    Birth control/protection: None  Other Topics Concern   Not on file  Social History Narrative   Married    1 daughter    Works with gutters     No guns, wears seat belt, safe in relationship    HS ed    General labor    Social Drivers of Corporate investment banker Strain: Not on file  Food Insecurity: Not on file  Transportation Needs: Not on file  Physical Activity: Not on file  Stress: Not on file  Social Connections: Not on file    Objective:  BP 116/78   Pulse 87   Temp (!) 97.5 F (36.4 C)   Ht 5' 8 (1.727 m)   Wt 184 lb 6.4 oz (83.6 kg)   SpO2 97%   BMI 28.04 kg/m      12/10/2023    7:39 AM 09/03/2023    8:11 AM 08/06/2023    8:43 AM  BP/Weight  Systolic BP 116 110 122  Diastolic BP 78 80 88  Wt. (Lbs) 184.4 179 177.8  BMI 28.04 kg/m2 27.22 kg/m2 27.03 kg/m2    Physical Exam Vitals reviewed.  Constitutional:      Appearance: Normal appearance.  Cardiovascular:  Rate and Rhythm: Normal rate and regular rhythm.     Heart sounds: Normal heart sounds.   Pulmonary:     Effort: Pulmonary effort is normal.     Breath sounds: Wheezing present.  Abdominal:     General: Bowel sounds are normal.     Palpations: Abdomen is soft.     Tenderness: There is no abdominal tenderness.  Neurological:     Mental Status: He is alert and oriented to person, place, and time.  Psychiatric:        Mood and Affect: Mood normal.        Behavior: Behavior normal.       Lab Results  Component Value Date   WBC 4.1 08/06/2023   HGB 15.2 08/06/2023   HCT 44.4 08/06/2023   PLT 192 08/06/2023   GLUCOSE 106 (H) 08/06/2023   CHOL 208 (H) 08/06/2023   TRIG 72 08/06/2023   HDL 56 08/06/2023   LDLCALC 139 (H) 08/06/2023   ALT 20 08/06/2023   AST 21 08/06/2023   NA 138 08/06/2023   K 4.4 08/06/2023   CL 102 08/06/2023   CREATININE 0.87 08/06/2023   BUN 13 08/06/2023   CO2 20 08/06/2023   TSH 1.790 06/06/2023   HGBA1C 5.5 06/06/2023      Assessment & Plan:  Mixed hyperlipidemia Assessment & Plan: Monitored condition. Recent stress and diet may affect cholesterol. - Follow-up labs in March to reassess cholesterol levels.   Irritable bowel syndrome with diarrhea Assessment & Plan: Well-managed with normal colonoscopy. Symptoms improved with lifestyle changes. - Continue lifestyle modifications including running and hydration. - Continue fiber supplements.   Subacute cough Assessment & Plan: Acute cough, improving Improving with Mucinex and benzonatate . Likely mild lung inflammation. - Continue Mucinex and benzonatate . - Refill benzonatate  prescription. - Consider chest x-ray if cough persists beyond a month or worsens.  Orders: -     Benzonatate ; Take 1 capsule (200 mg total) by mouth 3 (three) times daily as needed for cough.  Dispense: 90 capsule; Refill: 1     Body mass index is 28.04 kg/m.   Meds ordered this encounter  Medications   benzonatate  (TESSALON ) 200 MG capsule    Sig: Take 1 capsule (200 mg total) by mouth 3  (three) times daily as needed for cough.    Dispense:  90 capsule    Refill:  1    No orders of the defined types were placed in this encounter.      Follow-up: Return in about 6 months (around 06/08/2024) for Chronic.   I,Lauren M Auman,acting as a Neurosurgeon for [employer], [employer].,have documented all relevant documentation on the behalf of Nola Angles, PA,as directed by  Nola Angles, PA while in the presence of Nola Angles, [place].   An After Visit Summary was printed and given to the patient.  Nola Angles, [place] [facility] [phone]

## 2023-12-10 ENCOUNTER — Ambulatory Visit: Admitting: Physician Assistant

## 2023-12-10 ENCOUNTER — Encounter: Payer: Self-pay | Admitting: Physician Assistant

## 2023-12-10 VITALS — BP 116/78 | HR 87 | Temp 97.5°F | Ht 68.0 in | Wt 184.4 lb

## 2023-12-10 DIAGNOSIS — R052 Subacute cough: Secondary | ICD-10-CM | POA: Diagnosis not present

## 2023-12-10 DIAGNOSIS — K580 Irritable bowel syndrome with diarrhea: Secondary | ICD-10-CM

## 2023-12-10 DIAGNOSIS — E782 Mixed hyperlipidemia: Secondary | ICD-10-CM | POA: Diagnosis not present

## 2023-12-10 MED ORDER — BENZONATATE 200 MG PO CAPS: 200.0000 mg | ORAL_CAPSULE | Freq: Three times a day (TID) | ORAL | 1 refills | Status: AC | PRN

## 2023-12-10 NOTE — Assessment & Plan Note (Signed)
 Well-managed with normal colonoscopy. Symptoms improved with lifestyle changes. - Continue lifestyle modifications including running and hydration. - Continue fiber supplements.

## 2023-12-10 NOTE — Assessment & Plan Note (Signed)
 Acute cough, improving Improving with Mucinex and benzonatate . Likely mild lung inflammation. - Continue Mucinex and benzonatate . - Refill benzonatate  prescription. - Consider chest x-ray if cough persists beyond a month or worsens.

## 2023-12-10 NOTE — Assessment & Plan Note (Signed)
 Monitored condition. Recent stress and diet may affect cholesterol. - Follow-up labs in March to reassess cholesterol levels.

## 2024-06-06 ENCOUNTER — Encounter: Admitting: Physician Assistant
